# Patient Record
Sex: Female | Born: 2000 | Hispanic: No | Marital: Single | Smoking: Current every day smoker
Health system: Southern US, Community
[De-identification: ages and names within clinical notes are randomized; demographics above are authoritative.]

## PROBLEM LIST (undated history)

## (undated) DIAGNOSIS — J45909 Unspecified asthma, uncomplicated: Secondary | ICD-10-CM

## (undated) HISTORY — PX: TYMPANOSTOMY TUBE PLACEMENT: SHX32

## (undated) HISTORY — PX: ADENOIDECTOMY: SUR15

## (undated) HISTORY — PX: TONSILLECTOMY: SUR1361

---
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
## 2019-01-06 ENCOUNTER — Ambulatory Visit: Payer: BLUE CROSS/BLUE SHIELD | Admitting: Allergy & Immunology

## 2019-03-03 DIAGNOSIS — M545 Low back pain: Secondary | ICD-10-CM | POA: Diagnosis not present

## 2019-03-03 DIAGNOSIS — R202 Paresthesia of skin: Secondary | ICD-10-CM | POA: Diagnosis not present

## 2019-03-03 DIAGNOSIS — M5442 Lumbago with sciatica, left side: Secondary | ICD-10-CM | POA: Diagnosis not present

## 2019-03-24 DIAGNOSIS — M5442 Lumbago with sciatica, left side: Secondary | ICD-10-CM | POA: Diagnosis not present

## 2019-03-28 DIAGNOSIS — M5442 Lumbago with sciatica, left side: Secondary | ICD-10-CM | POA: Diagnosis not present

## 2019-04-04 DIAGNOSIS — M5442 Lumbago with sciatica, left side: Secondary | ICD-10-CM | POA: Diagnosis not present

## 2019-04-09 DIAGNOSIS — S7002XA Contusion of left hip, initial encounter: Secondary | ICD-10-CM | POA: Diagnosis not present

## 2019-04-09 DIAGNOSIS — J45909 Unspecified asthma, uncomplicated: Secondary | ICD-10-CM | POA: Diagnosis not present

## 2019-04-09 DIAGNOSIS — H6123 Impacted cerumen, bilateral: Secondary | ICD-10-CM | POA: Diagnosis not present

## 2019-04-09 DIAGNOSIS — S79912A Unspecified injury of left hip, initial encounter: Secondary | ICD-10-CM | POA: Diagnosis not present

## 2019-04-09 DIAGNOSIS — Z79899 Other long term (current) drug therapy: Secondary | ICD-10-CM | POA: Diagnosis not present

## 2019-04-09 DIAGNOSIS — M25552 Pain in left hip: Secondary | ICD-10-CM | POA: Diagnosis not present

## 2019-04-11 DIAGNOSIS — M5442 Lumbago with sciatica, left side: Secondary | ICD-10-CM | POA: Diagnosis not present

## 2019-06-14 DIAGNOSIS — M5442 Lumbago with sciatica, left side: Secondary | ICD-10-CM | POA: Diagnosis not present

## 2019-06-14 DIAGNOSIS — R202 Paresthesia of skin: Secondary | ICD-10-CM | POA: Diagnosis not present

## 2019-06-14 DIAGNOSIS — Z68.41 Body mass index (BMI) pediatric, greater than or equal to 95th percentile for age: Secondary | ICD-10-CM | POA: Diagnosis not present

## 2019-06-14 DIAGNOSIS — R5383 Other fatigue: Secondary | ICD-10-CM | POA: Diagnosis not present

## 2019-06-19 DIAGNOSIS — N920 Excessive and frequent menstruation with regular cycle: Secondary | ICD-10-CM | POA: Diagnosis not present

## 2019-06-20 DIAGNOSIS — M5442 Lumbago with sciatica, left side: Secondary | ICD-10-CM | POA: Diagnosis not present

## 2019-06-20 DIAGNOSIS — R200 Anesthesia of skin: Secondary | ICD-10-CM | POA: Diagnosis not present

## 2019-06-20 DIAGNOSIS — M5126 Other intervertebral disc displacement, lumbar region: Secondary | ICD-10-CM | POA: Diagnosis not present

## 2019-06-20 DIAGNOSIS — M48061 Spinal stenosis, lumbar region without neurogenic claudication: Secondary | ICD-10-CM | POA: Diagnosis not present

## 2019-07-04 DIAGNOSIS — M5442 Lumbago with sciatica, left side: Secondary | ICD-10-CM | POA: Diagnosis not present

## 2019-07-12 DIAGNOSIS — M5416 Radiculopathy, lumbar region: Secondary | ICD-10-CM | POA: Diagnosis not present

## 2019-07-12 DIAGNOSIS — M48062 Spinal stenosis, lumbar region with neurogenic claudication: Secondary | ICD-10-CM | POA: Diagnosis not present

## 2019-07-12 DIAGNOSIS — Z6841 Body Mass Index (BMI) 40.0 and over, adult: Secondary | ICD-10-CM | POA: Diagnosis not present

## 2019-07-20 DIAGNOSIS — M48062 Spinal stenosis, lumbar region with neurogenic claudication: Secondary | ICD-10-CM | POA: Diagnosis not present

## 2019-07-20 DIAGNOSIS — M5416 Radiculopathy, lumbar region: Secondary | ICD-10-CM | POA: Diagnosis not present

## 2019-07-22 DIAGNOSIS — Z20828 Contact with and (suspected) exposure to other viral communicable diseases: Secondary | ICD-10-CM | POA: Diagnosis not present

## 2019-08-08 DIAGNOSIS — M5416 Radiculopathy, lumbar region: Secondary | ICD-10-CM | POA: Diagnosis not present

## 2019-08-08 DIAGNOSIS — M48062 Spinal stenosis, lumbar region with neurogenic claudication: Secondary | ICD-10-CM | POA: Diagnosis not present

## 2019-10-08 DIAGNOSIS — R067 Sneezing: Secondary | ICD-10-CM | POA: Diagnosis not present

## 2019-10-08 DIAGNOSIS — R112 Nausea with vomiting, unspecified: Secondary | ICD-10-CM | POA: Diagnosis not present

## 2019-10-08 DIAGNOSIS — R197 Diarrhea, unspecified: Secondary | ICD-10-CM | POA: Diagnosis not present

## 2019-10-08 DIAGNOSIS — R509 Fever, unspecified: Secondary | ICD-10-CM | POA: Diagnosis not present

## 2019-10-08 DIAGNOSIS — R42 Dizziness and giddiness: Secondary | ICD-10-CM | POA: Diagnosis not present

## 2019-11-10 DIAGNOSIS — R110 Nausea: Secondary | ICD-10-CM | POA: Diagnosis not present

## 2019-11-10 DIAGNOSIS — H8309 Labyrinthitis, unspecified ear: Secondary | ICD-10-CM | POA: Diagnosis not present

## 2019-12-17 DIAGNOSIS — H6123 Impacted cerumen, bilateral: Secondary | ICD-10-CM | POA: Diagnosis not present

## 2019-12-17 DIAGNOSIS — R42 Dizziness and giddiness: Secondary | ICD-10-CM | POA: Diagnosis not present

## 2020-01-15 ENCOUNTER — Encounter (HOSPITAL_COMMUNITY): Payer: Self-pay | Admitting: *Deleted

## 2020-01-15 ENCOUNTER — Other Ambulatory Visit: Payer: Self-pay

## 2020-01-15 ENCOUNTER — Emergency Department (HOSPITAL_COMMUNITY)
Admission: EM | Admit: 2020-01-15 | Discharge: 2020-01-15 | Disposition: A | Discharge status: Home / Self Care | Payer: BC Managed Care – PPO | Attending: Emergency Medicine | Admitting: Emergency Medicine

## 2020-01-15 DIAGNOSIS — M5432 Sciatica, left side: Secondary | ICD-10-CM

## 2020-01-15 DIAGNOSIS — Z7722 Contact with and (suspected) exposure to environmental tobacco smoke (acute) (chronic): Secondary | ICD-10-CM | POA: Insufficient documentation

## 2020-01-15 DIAGNOSIS — M79605 Pain in left leg: Secondary | ICD-10-CM | POA: Diagnosis not present

## 2020-01-15 DIAGNOSIS — Z7951 Long term (current) use of inhaled steroids: Secondary | ICD-10-CM | POA: Insufficient documentation

## 2020-01-15 DIAGNOSIS — M5442 Lumbago with sciatica, left side: Secondary | ICD-10-CM | POA: Diagnosis not present

## 2020-01-15 DIAGNOSIS — J45909 Unspecified asthma, uncomplicated: Secondary | ICD-10-CM | POA: Insufficient documentation

## 2020-01-15 DIAGNOSIS — M545 Low back pain: Secondary | ICD-10-CM | POA: Diagnosis not present

## 2020-01-15 LAB — URINALYSIS, ROUTINE W REFLEX MICROSCOPIC
Bilirubin Urine: NEGATIVE
Glucose, UA: NEGATIVE mg/dL
Hgb urine dipstick: NEGATIVE
Ketones, ur: NEGATIVE mg/dL
Leukocytes,Ua: NEGATIVE
Nitrite: NEGATIVE
Protein, ur: NEGATIVE mg/dL
Specific Gravity, Urine: 1.011 (ref 1.005–1.030)
pH: 7 (ref 5.0–8.0)

## 2020-01-15 LAB — PREGNANCY, URINE: Preg Test, Ur: NEGATIVE

## 2020-01-15 MED ORDER — IBUPROFEN 600 MG PO TABS: 600.0000 mg | ORAL_TABLET | Freq: Four times a day (QID) | ORAL | 0 refills | Status: DC | PRN

## 2020-01-15 MED ORDER — IBUPROFEN 800 MG PO TABS
800.0000 mg | ORAL_TABLET | Freq: Once | ORAL | Status: AC
  Administered 2020-01-15: 800 mg via ORAL
  Filled 2020-01-15: qty 1

## 2020-01-15 MED ORDER — TRAMADOL HCL 50 MG PO TABS: 50.0000 mg | ORAL_TABLET | Freq: Four times a day (QID) | ORAL | 0 refills | Status: DC | PRN

## 2020-01-15 MED ORDER — CYCLOBENZAPRINE HCL 10 MG PO TABS: 10.0000 mg | ORAL_TABLET | Freq: Three times a day (TID) | ORAL | 0 refills | Status: DC | PRN

## 2020-01-15 MED ORDER — CYCLOBENZAPRINE HCL 10 MG PO TABS
10.0000 mg | ORAL_TABLET | Freq: Once | ORAL | Status: AC
  Administered 2020-01-15: 10 mg via ORAL
  Filled 2020-01-15: qty 1

## 2020-01-15 NOTE — ED Provider Notes (Signed)
[facility] EMERGENCY DEPARTMENT Provider Note   CSN: [id number] Arrival date & time: 01/15/20  1311     History Chief Complaint  Patient presents with  . Back Pain    Carrie Oneal is a 19 y.o. female.  HPI      Carrie Oneal is a 19 y.o. female who presents to the Emergency Department complaining of left low back and leg pain since yesterday.  She describes a sharp pain from the left side of her lower back that radiates into her hip and buttock down her left leg to the level of her foot.  She also reports a intermittent tingling and numbness sensation of her mid lower leg down to her foot.  This is positional and mostly when lying supine.  Symptoms improve with sitting.  She describes pain associated with walking or standing.  No known injury.  She reports having been diagnosed with sciatica last year and current symptoms feel similar.  She also endorses having increasing pain with urination but denies burning with urination, hematuria, abnormal vaginal bleeding or discharge.  She denies abdominal pain, fever, chills, urine or bowel incontinence or retention., weakness of her lower extremities and swelling.   Past Medical History:  Diagnosis Date  . Asthma     Patient Active Problem List   Diagnosis Date Noted  . Gastroesophageal reflux disease 09/29/2016  . Severe persistent asthma without complication 08/11/2016  . Seasonal and perennial allergic rhinitis 08/11/2016    Past Surgical History:  Procedure Laterality Date  . ADENOIDECTOMY    . TONSILLECTOMY    . TYMPANOSTOMY TUBE PLACEMENT       OB History   No obstetric history on file.     Family History  Problem Relation Age of Onset  . Asthma Father   . COPD Father   . Asthma Paternal Aunt   . Asthma Paternal Grandmother   . Asthma Paternal Grandfather   . Asthma Paternal Aunt     Social History   Tobacco Use  . Smoking status: Passive Smoke Exposure - Never Smoker  . Smokeless tobacco: Never Used    Vaping Use  . Vaping Use: Never used  Substance Use Topics  . Alcohol use: No  . Drug use: Yes    Types: Marijuana    Comment: everyday    Home Medications Prior to Admission medications   Medication Sig Start Date End Date Taking? Authorizing Provider  albuterol (PROVENTIL HFA;VENTOLIN HFA) 108 (90 Base) MCG/ACT inhaler Inhale into the lungs.    [provider]  budesonide-formoterol (SYMBICORT) 80-4.5 MCG/ACT inhaler Inhale 2 puffs into the lungs 2 (two) times daily. 07/08/18   Alfonse Spruce, MD  EPINEPHrine (AUVI-Q) 0.3 mg/0.3 mL IJ SOAJ injection Use as directed for severe allergic reaction 09/29/16   Alfonse Spruce, MD  fluticasone [facility]) 50 MCG/ACT nasal spray Place 2 sprays into both nostrils daily. 09/29/16   Alfonse Spruce, MD  montelukast (SINGULAIR) 10 MG tablet  07/20/16   [provider]  omeprazole (PRILOSEC) 20 MG capsule Take 1 capsule (20 mg total) by mouth daily. 08/11/16 07/22/17  Alfonse Spruce, MD  ondansetron [facility]) 8 MG tablet  08/31/17   [provider]    Allergies    Patient has no known allergies.  Review of Systems   Review of Systems  Constitutional: Negative for fever.  Respiratory: Negative for cough, chest tightness and shortness of breath.   Cardiovascular: Negative for chest pain.  Gastrointestinal: Negative for abdominal  pain, constipation, diarrhea, nausea and vomiting.  Genitourinary: Negative for decreased urine volume, difficulty urinating, dysuria, flank pain and hematuria.  Musculoskeletal: Positive for back pain. Negative for joint swelling and neck pain.  Skin: Negative for rash.  Neurological: Negative for dizziness, syncope, weakness and numbness.  Psychiatric/Behavioral: Negative for confusion.    Physical Exam Updated Vital Signs BP (!) 141/82   Pulse 82   Temp 98 F (36.7 C)   Resp 20   Ht 5\' 7"  (1.702 m)   Wt 129.3 kg   LMP 12/14/2019   SpO2 100%   BMI 44.64 kg/m    Physical Exam Vitals and nursing note reviewed.  Constitutional:      General: She is not in acute distress.    Appearance: Normal appearance. She is well-developed. She is not ill-appearing.  HENT:     Head: Normocephalic and atraumatic.     Mouth/Throat:     Mouth: Mucous membranes are moist.  Cardiovascular:     Rate and Rhythm: Normal rate and regular rhythm.     Pulses: Normal pulses.     Comments: DP pulses are strong and palpable bilaterally Pulmonary:     Effort: Pulmonary effort is normal. No respiratory distress.     Breath sounds: Normal breath sounds.  Abdominal:     General: There is no distension.     Palpations: Abdomen is soft.     Tenderness: There is no abdominal tenderness.  Musculoskeletal:        General: Tenderness present. No signs of injury.     Cervical back: Normal range of motion and neck supple.     Lumbar back: Tenderness present. No swelling, deformity or lacerations. Normal range of motion.     Right lower leg: No edema.     Left lower leg: No edema.     Comments: ttp of the left lower lumbar paraspinal muscles and left SI joint space.  Pt has 5/5 strength against resistance of bilateral lower extremities.  Sensation intact.  No tenderness or edema of the left posterior lower leg.  No palpable cord.   Skin:    General: Skin is warm.     Capillary Refill: Capillary refill takes less than 2 seconds.     Findings: No rash.  Neurological:     General: No focal deficit present.     Mental Status: She is alert and oriented to person, place, and time.     Sensory: No sensory deficit.     Motor: No weakness or abnormal muscle tone.     Gait: Gait normal.     Deep Tendon Reflexes:     Reflex Scores:      Patellar reflexes are 2+ on the right side and 2+ on the left side.      Achilles reflexes are 2+ on the right side and 2+ on the left side.    ED Results / Procedures / Treatments   Labs (all labs ordered are listed, but only abnormal results  are displayed) Labs Reviewed  PREGNANCY, URINE  URINALYSIS, ROUTINE W REFLEX MICROSCOPIC    EKG None  Radiology No results found.  Procedures Procedures (including critical care time)  Medications Ordered in ED Medications - No data to display  ED Course  I have reviewed the triage vital signs and the nursing notes.  Pertinent labs & imaging results that were available during my care of the patient were reviewed by me and considered in my medical decision making (see chart for  details).    MDM Rules/Calculators/A&P                          Patient with left lower back pain with radiating pain into her left leg.  Intermittent paresthesias of the lower leg and foot only.  Patient is ambulatory with a steady gait.  No focal neuro deficits.  There is no symptoms to suggest DVT, doubt cauda equina.  Symptoms are recurrent.  No reported injury or trauma.  Symptoms likely related to sciatica.  Patient agrees to treatment plan and close outpatient follow-up.  Return precautions discussed.   Final Clinical Impression(s) / ED Diagnoses Final diagnoses:  Sciatica of left side    Rx / DC Orders ED Discharge Orders    None       Pauline Aus, PA-C 01/17/20 4034    Bethann Berkshire, MD 01/21/20 [phone]

## 2020-01-15 NOTE — ED Triage Notes (Signed)
Low back pain radiating into legs, states legs and feet are numb when lying down

## 2020-01-15 NOTE — Discharge Instructions (Signed)
Alternate ice and heat to your lower back. Avoid bending over, twisting, or heavy lifting for at least 7 days. Follow-up with your primary provider for recheck

## 2020-02-15 DIAGNOSIS — R197 Diarrhea, unspecified: Secondary | ICD-10-CM | POA: Diagnosis not present

## 2020-02-15 DIAGNOSIS — R1110 Vomiting, unspecified: Secondary | ICD-10-CM | POA: Diagnosis not present

## 2020-02-26 DIAGNOSIS — Z20822 Contact with and (suspected) exposure to covid-19: Secondary | ICD-10-CM | POA: Diagnosis not present

## 2020-03-13 DIAGNOSIS — Z20822 Contact with and (suspected) exposure to covid-19: Secondary | ICD-10-CM | POA: Diagnosis not present

## 2020-04-03 DIAGNOSIS — Z20822 Contact with and (suspected) exposure to covid-19: Secondary | ICD-10-CM | POA: Diagnosis not present

## 2020-04-11 DIAGNOSIS — R1084 Generalized abdominal pain: Secondary | ICD-10-CM | POA: Diagnosis not present

## 2020-04-11 DIAGNOSIS — Z20828 Contact with and (suspected) exposure to other viral communicable diseases: Secondary | ICD-10-CM | POA: Diagnosis not present

## 2020-04-11 DIAGNOSIS — R197 Diarrhea, unspecified: Secondary | ICD-10-CM | POA: Diagnosis not present

## 2020-04-11 DIAGNOSIS — R1110 Vomiting, unspecified: Secondary | ICD-10-CM | POA: Diagnosis not present

## 2020-04-12 DIAGNOSIS — K529 Noninfective gastroenteritis and colitis, unspecified: Secondary | ICD-10-CM | POA: Diagnosis not present

## 2020-04-24 ENCOUNTER — Other Ambulatory Visit: Payer: Self-pay

## 2020-04-24 ENCOUNTER — Emergency Department (HOSPITAL_COMMUNITY)
Admission: EM | Admit: 2020-04-24 | Discharge: 2020-04-25 | Disposition: A | Discharge status: Home / Self Care | Payer: BC Managed Care – PPO | Attending: Emergency Medicine | Admitting: Emergency Medicine

## 2020-04-24 ENCOUNTER — Emergency Department (HOSPITAL_COMMUNITY): Payer: BC Managed Care – PPO

## 2020-04-24 ENCOUNTER — Encounter (HOSPITAL_COMMUNITY): Payer: Self-pay

## 2020-04-24 DIAGNOSIS — Z7722 Contact with and (suspected) exposure to environmental tobacco smoke (acute) (chronic): Secondary | ICD-10-CM | POA: Diagnosis not present

## 2020-04-24 DIAGNOSIS — K219 Gastro-esophageal reflux disease without esophagitis: Secondary | ICD-10-CM | POA: Insufficient documentation

## 2020-04-24 DIAGNOSIS — R1013 Epigastric pain: Secondary | ICD-10-CM | POA: Diagnosis not present

## 2020-04-24 DIAGNOSIS — K2970 Gastritis, unspecified, without bleeding: Secondary | ICD-10-CM | POA: Insufficient documentation

## 2020-04-24 DIAGNOSIS — Z7951 Long term (current) use of inhaled steroids: Secondary | ICD-10-CM | POA: Insufficient documentation

## 2020-04-24 DIAGNOSIS — R109 Unspecified abdominal pain: Secondary | ICD-10-CM | POA: Diagnosis not present

## 2020-04-24 DIAGNOSIS — J45909 Unspecified asthma, uncomplicated: Secondary | ICD-10-CM | POA: Diagnosis not present

## 2020-04-24 LAB — COMPREHENSIVE METABOLIC PANEL
ALT: 23 U/L (ref 0–44)
AST: 20 U/L (ref 15–41)
Albumin: 4.3 g/dL (ref 3.5–5.0)
Alkaline Phosphatase: 71 U/L (ref 38–126)
Anion gap: 10 (ref 5–15)
BUN: 9 mg/dL (ref 6–20)
CO2: 23 mmol/L (ref 22–32)
Calcium: 9.5 mg/dL (ref 8.9–10.3)
Chloride: 104 mmol/L (ref 98–111)
Creatinine, Ser: 0.68 mg/dL (ref 0.44–1.00)
GFR, Estimated: >60 mL/min (ref >60)
Glucose, Bld: 84 mg/dL (ref 70–99)
Potassium: 3.6 mmol/L (ref 3.5–5.1)
Sodium: 137 mmol/L (ref 135–145)
Total Bilirubin: 0.9 mg/dL (ref 0.3–1.2)
Total Protein: 8 g/dL (ref 6.5–8.1)

## 2020-04-24 LAB — CBC
HCT: 42.1 % (ref 36.0–46.0)
Hemoglobin: 14 g/dL (ref 12.0–15.0)
MCH: 31.3 pg (ref 26.0–34.0)
MCHC: 33.3 g/dL (ref 30.0–36.0)
MCV: 94.2 fL (ref 80.0–100.0)
Platelets: 305 10*3/uL (ref 150–400)
RBC: 4.47 MIL/uL (ref 3.87–5.11)
RDW: 11.7 % (ref 11.5–15.5)
WBC: 10.3 10*3/uL (ref 4.0–10.5)
nRBC: 0 % (ref 0.0–0.2)

## 2020-04-24 LAB — URINALYSIS, ROUTINE W REFLEX MICROSCOPIC
Bacteria, UA: NONE SEEN
Bilirubin Urine: NEGATIVE
Glucose, UA: NEGATIVE mg/dL
Ketones, ur: 20 mg/dL — AB
Leukocytes,Ua: NEGATIVE
Nitrite: NEGATIVE
Protein, ur: 30 mg/dL — AB
Specific Gravity, Urine: 1.035 — ABNORMAL HIGH (ref 1.005–1.030)
pH: 5 (ref 5.0–8.0)

## 2020-04-24 LAB — POC URINE PREG, ED: Preg Test, Ur: NEGATIVE

## 2020-04-24 LAB — LIPASE, BLOOD: Lipase: 16 U/L (ref 11–51)

## 2020-04-24 MED ORDER — LACTATED RINGERS IV BOLUS
500.0000 mL | Freq: Once | INTRAVENOUS | Status: AC
  Administered 2020-04-24: 500 mL via INTRAVENOUS

## 2020-04-24 MED ORDER — IOHEXOL 300 MG/ML  SOLN
100.0000 mL | Freq: Once | INTRAMUSCULAR | Status: AC | PRN
  Administered 2020-04-25: 100 mL via INTRAVENOUS

## 2020-04-24 MED ORDER — PROCHLORPERAZINE EDISYLATE 10 MG/2ML IJ SOLN
10.0000 mg | Freq: Once | INTRAMUSCULAR | Status: AC
  Administered 2020-04-24: 10 mg via INTRAVENOUS
  Filled 2020-04-24: qty 2

## 2020-04-24 MED ORDER — FENTANYL CITRATE (PF) 100 MCG/2ML IJ SOLN
50.0000 ug | Freq: Once | INTRAMUSCULAR | Status: AC
  Administered 2020-04-24: 50 ug via INTRAVENOUS
  Filled 2020-04-24: qty 2

## 2020-04-24 NOTE — ED Triage Notes (Signed)
Pt to er, pt states that she is here for some abd pain for the past 3-4 weeks, states that she saw her pmd and was told to come to the er for r/o problems with her gallbladder.

## 2020-04-24 NOTE — ED Notes (Signed)
Per registration pt left, but then pt returned and states that her mother wont let her leave.

## 2020-04-25 ENCOUNTER — Encounter (INDEPENDENT_AMBULATORY_CARE_PROVIDER_SITE_OTHER): Payer: Self-pay | Admitting: *Deleted

## 2020-04-25 DIAGNOSIS — R109 Unspecified abdominal pain: Secondary | ICD-10-CM | POA: Diagnosis not present

## 2020-04-25 MED ORDER — AMOXICILLIN 500 MG PO CAPS: 1000.0000 mg | ORAL_CAPSULE | Freq: Two times a day (BID) | ORAL | 0 refills | Status: AC

## 2020-04-25 MED ORDER — CLARITHROMYCIN 500 MG PO TABS
500.0000 mg | ORAL_TABLET | Freq: Once | ORAL | Status: AC
  Administered 2020-04-25: 500 mg via ORAL
  Filled 2020-04-25 (×2): qty 1

## 2020-04-25 MED ORDER — PANTOPRAZOLE SODIUM 20 MG PO TBEC: 20.0000 mg | DELAYED_RELEASE_TABLET | Freq: Two times a day (BID) | ORAL | 0 refills | Status: DC

## 2020-04-25 MED ORDER — CLARITHROMYCIN 500 MG PO TABS: 500.0000 mg | ORAL_TABLET | Freq: Two times a day (BID) | ORAL | 0 refills | Status: AC

## 2020-04-25 MED ORDER — AMOXICILLIN 250 MG PO CAPS
1000.0000 mg | ORAL_CAPSULE | Freq: Once | ORAL | Status: AC
  Administered 2020-04-25: 1000 mg via ORAL
  Filled 2020-04-25: qty 4

## 2020-04-25 MED ORDER — PANTOPRAZOLE SODIUM 40 MG PO TBEC
40.0000 mg | DELAYED_RELEASE_TABLET | Freq: Once | ORAL | Status: AC
  Administered 2020-04-25: 40 mg via ORAL
  Filled 2020-04-25: qty 1

## 2020-04-25 NOTE — ED Provider Notes (Signed)
[facility] EMERGENCY DEPARTMENT Provider Note   CSN: [id number] Arrival date & time: 04/24/20  1923     History Chief Complaint  Patient presents with  . Abdominal Pain    Carrie Oneal is a 19 y.o. female.  Multiple episodes of nonbloody nonbilious emesis over last 3 to 4 weeks.  Associated with pretty severe epigastric pain that radiates to left.  States it happens within a minute or 2 of eating.  Has tested positive for H. pylori but not started on any gastritis medications.  Dr. Jamal Maes hyoscyamine which helped a little bit.  Poorly seen here to rule out gallbladder pathology.   Abdominal Pain Pain location:  Epigastric and LUQ Pain quality: gnawing and sharp   Pain radiates to:  Chest Pain severity:  Mild Timing:  Intermittent Progression:  Waxing and waning Context: eating   Worsened by:  Eating Ineffective treatments:  None tried      Past Medical History:  Diagnosis Date  . Asthma     Patient Active Problem List   Diagnosis Date Noted  . Gastroesophageal reflux disease 09/29/2016  . Severe persistent asthma without complication 08/11/2016  . Seasonal and perennial allergic rhinitis 08/11/2016    Past Surgical History:  Procedure Laterality Date  . ADENOIDECTOMY    . TONSILLECTOMY    . TYMPANOSTOMY TUBE PLACEMENT       OB History   No obstetric history on file.     Family History  Problem Relation Age of Onset  . Asthma Father   . COPD Father   . Asthma Paternal Aunt   . Asthma Paternal Grandmother   . Asthma Paternal Grandfather   . Asthma Paternal Aunt     Social History   Tobacco Use  . Smoking status: Passive Smoke Exposure - Never Smoker  . Smokeless tobacco: Never Used  Vaping Use  . Vaping Use: Never used  Substance Use Topics  . Alcohol use: No  . Drug use: Yes    Types: Marijuana    Comment: everyday    Home Medications Prior to Admission medications   Medication Sig Start Date End Date Taking? Authorizing Provider    albuterol (PROVENTIL HFA;VENTOLIN HFA) 108 (90 Base) MCG/ACT inhaler Inhale 1-2 puffs into the lungs every 6 (six) hours as needed for wheezing or shortness of breath.     [provider]  amoxicillin (AMOXIL) 500 MG capsule Take 2 capsules (1,000 mg total) by mouth 2 (two) times daily for 10 days. 04/25/20 05/05/20  Namya Voges, Barbara Cower, MD  budesonide-formoterol (SYMBICORT) 80-4.5 MCG/ACT inhaler Inhale 2 puffs into the lungs 2 (two) times daily. Patient taking differently: Inhale 2 puffs into the lungs daily as needed (for shortness of breath).  07/08/18   Alfonse Spruce, MD  clarithromycin (BIAXIN) 500 MG tablet Take 1 tablet (500 mg total) by mouth 2 (two) times daily for 10 days. 04/25/20 05/05/20  Miyuki Rzasa, Barbara Cower, MD  cyclobenzaprine (FLEXERIL) 10 MG tablet Take 1 tablet (10 mg total) by mouth 3 (three) times daily as needed. 01/15/20   Triplett, Tammy, PA-C  EPINEPHrine (AUVI-Q) 0.3 mg/0.3 mL IJ SOAJ injection Use as directed for severe allergic reaction Patient taking differently: Inject 0.3 mg into the muscle as needed for anaphylaxis. Use as directed for severe allergic reaction 09/29/16   Alfonse Spruce, MD  etonogestrel (NEXPLANON) 68 MG IMPL implant 1 each by Subdermal route once.    [provider]  ibuprofen (IBU) 600 MG tablet Take 1 tablet (600 mg total)  by mouth every 6 (six) hours as needed for moderate pain. Take with food 01/15/20   Triplett, Tammy, PA-C  pantoprazole (PROTONIX) 20 MG tablet Take 1 tablet (20 mg total) by mouth 2 (two) times daily for 10 days. 04/25/20 05/05/20  Whittaker Lenis, Barbara Cower, MD  traMADol (ULTRAM) 50 MG tablet Take 1 tablet (50 mg total) by mouth every 6 (six) hours as needed. 01/15/20   Triplett, Tammy, PA-C    Allergies    Patient has no known allergies.  Review of Systems   Review of Systems  Gastrointestinal: Positive for abdominal pain.  All other systems reviewed and are negative.   Physical Exam Updated Vital Signs BP 110/84 (BP  Location: Left Arm)   Pulse 73   Temp 98.4 F (36.9 C) (Oral)   Resp 18   Ht 5\' 7"  (1.702 m)   Wt 124.7 kg   SpO2 98%   BMI 43.07 kg/m   Physical Exam Vitals and nursing note reviewed.  Constitutional:      Appearance: She is well-developed.  HENT:     Head: Normocephalic and atraumatic.     Mouth/Throat:     Mouth: Mucous membranes are moist.     Pharynx: Oropharynx is clear.  Eyes:     Pupils: Pupils are equal, round, and reactive to light.  Cardiovascular:     Rate and Rhythm: Normal rate and regular rhythm.  Pulmonary:     Effort: No respiratory distress.     Breath sounds: No stridor.  Abdominal:     General: There is no distension.     Tenderness: There is abdominal tenderness (epigastric and LUQ).  Musculoskeletal:        General: No swelling or tenderness. Normal range of motion.     Cervical back: Normal range of motion.  Skin:    General: Skin is warm and dry.  Neurological:     General: No focal deficit present.     Mental Status: She is alert.     ED Results / Procedures / Treatments   Labs (all labs ordered are listed, but only abnormal results are displayed) Labs Reviewed  URINALYSIS, ROUTINE W REFLEX MICROSCOPIC - Abnormal; Notable for the following components:      Result Value   Color, Urine AMBER (*)    Specific Gravity, Urine 1.035 (*)    Hgb urine dipstick MODERATE (*)    Ketones, ur 20 (*)    Protein, ur 30 (*)    All other components within normal limits  LIPASE, BLOOD  COMPREHENSIVE METABOLIC PANEL  CBC  POC URINE PREG, ED    EKG None  Radiology CT ABDOMEN PELVIS W CONTRAST  Result Date: 04/25/2020 CLINICAL DATA:  Abdominal pain EXAM: CT ABDOMEN AND PELVIS WITH CONTRAST TECHNIQUE: Multidetector CT imaging of the abdomen and pelvis was performed using the standard protocol following bolus administration of intravenous contrast. CONTRAST:  04/27/2020 OMNIPAQUE IOHEXOL 300 MG/ML  SOLN COMPARISON:  None. FINDINGS: Lower chest: The  visualized heart size within normal limits. No pericardial fluid/thickening. No hiatal hernia. The visualized portions of the lungs are clear. Hepatobiliary: The liver is normal in density without focal abnormality.The main portal vein is patent. No evidence of calcified gallstones, gallbladder wall thickening or biliary dilatation. Pancreas: Unremarkable. No pancreatic ductal dilatation or surrounding inflammatory changes. Spleen: Normal in size without focal abnormality. Adrenals/Urinary Tract: Both adrenal glands appear normal. The kidneys and collecting system appear normal without evidence of urinary tract calculus or hydronephrosis. Bladder is unremarkable. Stomach/Bowel:  The stomach, small bowel, and colon are normal in appearance. No inflammatory changes, wall thickening, or obstructive findings.The appendix is normal. Vascular/Lymphatic: There are no enlarged mesenteric, retroperitoneal, or pelvic lymph nodes. No significant vascular findings are present. Reproductive: Probable 2.5 cm left ovarian cyst versus dominant follicle. Other: No evidence of abdominal wall mass or hernia. Musculoskeletal: No acute or significant osseous findings. IMPRESSION: No acute intra-abdominal to explain the patient's symptoms. Probable 2.5 cm left ovarian cyst versus dominant follicle. Electronically Signed   By: Jonna Clark M.D.   On: 04/25/2020 00:18    Procedures Procedures (including critical care time)  Medications Ordered in ED Medications  fentaNYL (SUBLIMAZE) injection 50 mcg (50 mcg Intravenous Given 04/24/20 2342)  lactated ringers bolus 500 mL (0 mLs Intravenous Stopped 04/25/20 0114)  prochlorperazine (COMPAZINE) injection 10 mg (10 mg Intravenous Given 04/24/20 2342)  iohexol (OMNIPAQUE) 300 MG/ML solution 100 mL (100 mLs Intravenous Contrast Given 04/25/20 0003)  amoxicillin (AMOXIL) capsule 1,000 mg (1,000 mg Oral Given 04/25/20 0114)  clarithromycin (BIAXIN) tablet 500 mg (500 mg Oral Given  04/25/20 0118)  pantoprazole (PROTONIX) EC tablet 40 mg (40 mg Oral Given 04/25/20 0114)    ED Course  I have reviewed the triage vital signs and the nursing notes.  Pertinent labs & imaging results that were available during my care of the patient were reviewed by me and considered in my medical decision making (see chart for details).    MDM Rules/Calculators/A&P                          I think it is unlikely to be a gall baldder problem. epigastric and LUQ abdominal pain with emesis shortly after eating. Reportedly positive for h pylori already. I think PUD is most likely so will initiate triple therapy if ct negative.   Ct fine. Will plan for GI follow up.   Final Clinical Impression(s) / ED Diagnoses Final diagnoses:  Gastritis without bleeding, unspecified chronicity, unspecified gastritis type    Rx / DC Orders ED Discharge Orders         Ordered    amoxicillin (AMOXIL) 500 MG capsule  2 times daily        04/25/20 0041    pantoprazole (PROTONIX) 20 MG tablet  2 times daily        04/25/20 0041    clarithromycin (BIAXIN) 500 MG tablet  2 times daily        04/25/20 0041    Ambulatory referral to Gastroenterology        04/25/20 0041           Marguarite Markov, Barbara Cower, MD 04/25/20 0246

## 2020-04-26 DIAGNOSIS — K279 Peptic ulcer, site unspecified, unspecified as acute or chronic, without hemorrhage or perforation: Secondary | ICD-10-CM | POA: Diagnosis not present

## 2020-05-02 ENCOUNTER — Other Ambulatory Visit: Payer: Self-pay

## 2020-05-02 ENCOUNTER — Emergency Department (HOSPITAL_COMMUNITY)
Admission: EM | Admit: 2020-05-02 | Discharge: 2020-05-02 | Disposition: A | Discharge status: Home / Self Care | Payer: BC Managed Care – PPO | Attending: Emergency Medicine | Admitting: Emergency Medicine

## 2020-05-02 ENCOUNTER — Encounter (HOSPITAL_COMMUNITY): Payer: Self-pay | Admitting: Emergency Medicine

## 2020-05-02 DIAGNOSIS — B373 Candidiasis of vulva and vagina: Secondary | ICD-10-CM | POA: Insufficient documentation

## 2020-05-02 DIAGNOSIS — Z79899 Other long term (current) drug therapy: Secondary | ICD-10-CM | POA: Diagnosis not present

## 2020-05-02 DIAGNOSIS — K219 Gastro-esophageal reflux disease without esophagitis: Secondary | ICD-10-CM | POA: Diagnosis not present

## 2020-05-02 DIAGNOSIS — R1084 Generalized abdominal pain: Secondary | ICD-10-CM | POA: Diagnosis not present

## 2020-05-02 DIAGNOSIS — J4550 Severe persistent asthma, uncomplicated: Secondary | ICD-10-CM | POA: Diagnosis not present

## 2020-05-02 DIAGNOSIS — Z7722 Contact with and (suspected) exposure to environmental tobacco smoke (acute) (chronic): Secondary | ICD-10-CM | POA: Insufficient documentation

## 2020-05-02 DIAGNOSIS — B3731 Acute candidiasis of vulva and vagina: Secondary | ICD-10-CM

## 2020-05-02 DIAGNOSIS — R109 Unspecified abdominal pain: Secondary | ICD-10-CM | POA: Diagnosis not present

## 2020-05-02 LAB — CBC WITH DIFFERENTIAL/PLATELET
Abs Immature Granulocytes: 0.03 10*3/uL (ref 0.00–0.07)
Basophils Absolute: 0.1 10*3/uL (ref 0.0–0.1)
Basophils Relative: 1 %
Eosinophils Absolute: 0.2 10*3/uL (ref 0.0–0.5)
Eosinophils Relative: 2 %
HCT: 41.3 % (ref 36.0–46.0)
Hemoglobin: 13.6 g/dL (ref 12.0–15.0)
Immature Granulocytes: 0 %
Lymphocytes Relative: 33 %
Lymphs Abs: 3.4 10*3/uL (ref 0.7–4.0)
MCH: 31.2 pg (ref 26.0–34.0)
MCHC: 32.9 g/dL (ref 30.0–36.0)
MCV: 94.7 fL (ref 80.0–100.0)
Monocytes Absolute: 0.8 10*3/uL (ref 0.1–1.0)
Monocytes Relative: 8 %
Neutro Abs: 5.8 10*3/uL (ref 1.7–7.7)
Neutrophils Relative %: 56 %
Platelets: 310 10*3/uL (ref 150–400)
RBC: 4.36 MIL/uL (ref 3.87–5.11)
RDW: 11.8 % (ref 11.5–15.5)
WBC: 10.3 10*3/uL (ref 4.0–10.5)
nRBC: 0 % (ref 0.0–0.2)

## 2020-05-02 LAB — COMPREHENSIVE METABOLIC PANEL
ALT: 24 U/L (ref 0–44)
AST: 21 U/L (ref 15–41)
Albumin: 4.3 g/dL (ref 3.5–5.0)
Alkaline Phosphatase: 70 U/L (ref 38–126)
Anion gap: 7 (ref 5–15)
BUN: 10 mg/dL (ref 6–20)
CO2: 25 mmol/L (ref 22–32)
Calcium: 9.1 mg/dL (ref 8.9–10.3)
Chloride: 103 mmol/L (ref 98–111)
Creatinine, Ser: 0.86 mg/dL (ref 0.44–1.00)
GFR, Estimated: >60 mL/min (ref >60)
Glucose, Bld: 103 mg/dL — ABNORMAL HIGH (ref 70–99)
Potassium: 3.3 mmol/L — ABNORMAL LOW (ref 3.5–5.1)
Sodium: 135 mmol/L (ref 135–145)
Total Bilirubin: 0.7 mg/dL (ref 0.3–1.2)
Total Protein: 7.6 g/dL (ref 6.5–8.1)

## 2020-05-02 LAB — WET PREP, GENITAL
Clue Cells Wet Prep HPF POC: NONE SEEN
Sperm: NONE SEEN
Trich, Wet Prep: NONE SEEN
Yeast Wet Prep HPF POC: NONE SEEN

## 2020-05-02 LAB — LIPASE, BLOOD: Lipase: 19 U/L (ref 11–51)

## 2020-05-02 MED ORDER — ONDANSETRON HCL 4 MG/2ML IJ SOLN
4.0000 mg | Freq: Once | INTRAMUSCULAR | Status: AC
  Administered 2020-05-02: 4 mg via INTRAVENOUS
  Filled 2020-05-02: qty 2

## 2020-05-02 MED ORDER — SODIUM CHLORIDE 0.9 % IV BOLUS
1000.0000 mL | Freq: Once | INTRAVENOUS | Status: AC
  Administered 2020-05-02: 1000 mL via INTRAVENOUS

## 2020-05-02 MED ORDER — FLUCONAZOLE 100 MG PO TABS
200.0000 mg | ORAL_TABLET | Freq: Once | ORAL | Status: AC
  Administered 2020-05-02: 200 mg via ORAL
  Filled 2020-05-02: qty 2

## 2020-05-02 MED ORDER — HYDROCODONE-ACETAMINOPHEN 5-325 MG PO TABS
1.0000 | ORAL_TABLET | ORAL | 0 refills | Status: DC | PRN
Start: 2020-05-02 — End: 2020-05-29

## 2020-05-02 MED ORDER — FLUCONAZOLE 200 MG PO TABS: 200.0000 mg | ORAL_TABLET | Freq: Every day | ORAL | 0 refills | Status: DC

## 2020-05-02 MED ORDER — LIDOCAINE HCL URETHRAL/MUCOSAL 2 % EX GEL
1.0000 "application " | Freq: Once | CUTANEOUS | Status: AC
  Administered 2020-05-02: 1 via TOPICAL
  Filled 2020-05-02: qty 10

## 2020-05-02 MED ORDER — MORPHINE SULFATE (PF) 4 MG/ML IV SOLN
4.0000 mg | Freq: Once | INTRAVENOUS | Status: AC
  Administered 2020-05-02: 4 mg via INTRAVENOUS
  Filled 2020-05-02: qty 1

## 2020-05-02 NOTE — ED Triage Notes (Signed)
Pt c/o brown reddish drainage from vagina and states her vagina is closing up. Pt also c/o vaginal itching. Pt states her pee hole is bigger than her vagina.

## 2020-05-02 NOTE — ED Provider Notes (Signed)
[facility] EMERGENCY DEPARTMENT Provider Note   CSN: [id number] Arrival date & time: 05/02/20  0201     History Chief Complaint  Patient presents with  . Vaginal Discharge    Carrie Oneal is a 19 y.o. female.  Patient reports "My vagina is closing up.  My pee hole is bigger than my womb hole."  Patient was recently seen in the emergency department for abdominal pain.  She has been on antibiotics for presumed ulcer.  She reports that her abdominal pain has been persistent but now she has having increased pain in the area of her vagina that overpowered the abdominal pain.  She is scheduled for follow-up with GI but not until December.  Patient also has persistent low back pain from "bulging disks".  She cannot take the pain medicine for her back because of the presumed ulcer.        Past Medical History:  Diagnosis Date  . Asthma     Patient Active Problem List   Diagnosis Date Noted  . Gastroesophageal reflux disease 09/29/2016  . Severe persistent asthma without complication 08/11/2016  . Seasonal and perennial allergic rhinitis 08/11/2016    Past Surgical History:  Procedure Laterality Date  . ADENOIDECTOMY    . TONSILLECTOMY    . TYMPANOSTOMY TUBE PLACEMENT       OB History   No obstetric history on file.     Family History  Problem Relation Age of Onset  . Asthma Father   . COPD Father   . Asthma Paternal Aunt   . Asthma Paternal Grandmother   . Asthma Paternal Grandfather   . Asthma Paternal Aunt     Social History   Tobacco Use  . Smoking status: Passive Smoke Exposure - Never Smoker  . Smokeless tobacco: Never Used  Vaping Use  . Vaping Use: Every day  Substance Use Topics  . Alcohol use: No  . Drug use: Yes    Types: Marijuana    Comment: everyday    Home Medications Prior to Admission medications   Medication Sig Start Date End Date Taking? Authorizing Provider  albuterol (PROVENTIL HFA;VENTOLIN HFA) 108 (90 Base) MCG/ACT inhaler  Inhale 1-2 puffs into the lungs every 6 (six) hours as needed for wheezing or shortness of breath.     [provider]  amoxicillin (AMOXIL) 500 MG capsule Take 2 capsules (1,000 mg total) by mouth 2 (two) times daily for 10 days. 04/25/20 05/05/20  Mesner, Barbara Cower, MD  budesonide-formoterol (SYMBICORT) 80-4.5 MCG/ACT inhaler Inhale 2 puffs into the lungs 2 (two) times daily. Patient taking differently: Inhale 2 puffs into the lungs daily as needed (for shortness of breath).  07/08/18   Alfonse Spruce, MD  clarithromycin (BIAXIN) 500 MG tablet Take 1 tablet (500 mg total) by mouth 2 (two) times daily for 10 days. 04/25/20 05/05/20  Mesner, Barbara Cower, MD  cyclobenzaprine (FLEXERIL) 10 MG tablet Take 1 tablet (10 mg total) by mouth 3 (three) times daily as needed. 01/15/20   Triplett, Tammy, PA-C  EPINEPHrine (AUVI-Q) 0.3 mg/0.3 mL IJ SOAJ injection Use as directed for severe allergic reaction Patient taking differently: Inject 0.3 mg into the muscle as needed for anaphylaxis. Use as directed for severe allergic reaction 09/29/16   Alfonse Spruce, MD  etonogestrel (NEXPLANON) 68 MG IMPL implant 1 each by Subdermal route once.    [provider]  fluconazole (DIFLUCAN) 200 MG tablet Take 1 tablet (200 mg total) by mouth daily. 05/02/20   Jaci Carrel  J, MD  HYDROcodone-acetaminophen (NORCO/VICODIN) 5-325 MG tablet Take 1-2 tablets by mouth every 4 (four) hours as needed. 05/02/20   Gilda Crease, MD  ibuprofen (IBU) 600 MG tablet Take 1 tablet (600 mg total) by mouth every 6 (six) hours as needed for moderate pain. Take with food 01/15/20   Triplett, Tammy, PA-C  pantoprazole (PROTONIX) 20 MG tablet Take 1 tablet (20 mg total) by mouth 2 (two) times daily for 10 days. 04/25/20 05/05/20  Mesner, Barbara Cower, MD  traMADol (ULTRAM) 50 MG tablet Take 1 tablet (50 mg total) by mouth every 6 (six) hours as needed. 01/15/20   Triplett, Tammy, PA-C    Allergies    Patient has no known  allergies.  Review of Systems   Review of Systems  Gastrointestinal: Positive for abdominal pain.  Genitourinary: Positive for vaginal pain.  Musculoskeletal: Positive for back pain.  All other systems reviewed and are negative.   Physical Exam Updated Vital Signs BP (!) 139/93   Pulse 80   Temp 97.8 F (36.6 C)   Resp 18   Ht 5\' 7"  (1.702 m)   Wt 117.9 kg   LMP 04/25/2020   SpO2 100%   BMI 40.72 kg/m   Physical Exam Vitals and nursing note reviewed. Exam conducted with a chaperone present.  Constitutional:      General: She is not in acute distress.    Appearance: Normal appearance. She is well-developed.  HENT:     Head: Normocephalic and atraumatic.     Right Ear: Hearing normal.     Left Ear: Hearing normal.     Nose: Nose normal.  Eyes:     Conjunctiva/sclera: Conjunctivae normal.     Pupils: Pupils are equal, round, and reactive to light.  Cardiovascular:     Rate and Rhythm: Regular rhythm.     Heart sounds: S1 normal and S2 normal. No murmur heard.  No friction rub. No gallop.   Pulmonary:     Effort: Pulmonary effort is normal. No respiratory distress.     Breath sounds: Normal breath sounds.  Chest:     Chest wall: No tenderness.  Abdominal:     General: Bowel sounds are normal.     Palpations: Abdomen is soft.     Tenderness: There is no abdominal tenderness. There is no guarding or rebound. Negative signs include Murphy's sign and McBurney's sign.     Hernia: No hernia is present.  Genitourinary:    Labia:        Right: Rash and tenderness present.        Left: Rash and tenderness present.      Comments: External exam reveals significant erythema and swelling of the vulva with white patches and discharge on mucosa consistent with yeast.  Speculum exam deferred because of pain. Musculoskeletal:        General: Normal range of motion.     Cervical back: Normal range of motion and neck supple.  Skin:    General: Skin is warm and dry.     Findings:  No rash.  Neurological:     Mental Status: She is alert and oriented to person, place, and time.     GCS: GCS eye subscore is 4. GCS verbal subscore is 5. GCS motor subscore is 6.     Cranial Nerves: No cranial nerve deficit.     Sensory: No sensory deficit.     Coordination: Coordination normal.  Psychiatric:        Speech:  Speech normal.        Behavior: Behavior normal.        Thought Content: Thought content normal.     ED Results / Procedures / Treatments   Labs (all labs ordered are listed, but only abnormal results are displayed) Labs Reviewed  WET PREP, GENITAL - Abnormal; Notable for the following components:      Result Value   WBC, Wet Prep HPF POC MODERATE (*)    All other components within normal limits  COMPREHENSIVE METABOLIC PANEL - Abnormal; Notable for the following components:   Potassium 3.3 (*)    Glucose, Bld 103 (*)    All other components within normal limits  CBC WITH DIFFERENTIAL/PLATELET  LIPASE, BLOOD  GC/CHLAMYDIA PROBE AMP ([place]) NOT AT [facility]    EKG None  Radiology No results found.  Procedures Procedures (including critical care time)  Medications Ordered in ED Medications  sodium chloride 0.9 % bolus 1,000 mL (0 mLs Intravenous Stopped 05/02/20 0501)  morphine 4 MG/ML injection 4 mg (4 mg Intravenous Given 05/02/20 0307)  ondansetron (ZOFRAN) injection 4 mg (4 mg Intravenous Given 05/02/20 0308)  lidocaine (XYLOCAINE) 2 % jelly 1 application (1 application Topical Given 05/02/20 0308)  fluconazole (DIFLUCAN) tablet 200 mg (200 mg Oral Given 05/02/20 0307)    ED Course  I have reviewed the triage vital signs and the nursing notes.  Pertinent labs & imaging results that were available during my care of the patient were reviewed by me and considered in my medical decision making (see chart for details).    MDM Rules/Calculators/A&P                          Chaperoned pelvic exam reveals significant vulvovaginitis secondary to the  antibiotic use.  Will treat with Diflucan.  Patient continues to complain of diffuse abdominal pain.  Abdominal exam is benign.  Lab work is unremarkable.  She is being treated for presumed peptic ulcer.  No evidence of GI bleeding or anemia.  Patient has GI follow-up.  Final Clinical Impression(s) / ED Diagnoses Final diagnoses:  Vulvovaginal candidiasis  Generalized abdominal pain    Rx / DC Orders ED Discharge Orders         Ordered    fluconazole (DIFLUCAN) 200 MG tablet  Daily        05/02/20 0340    HYDROcodone-acetaminophen (NORCO/VICODIN) 5-325 MG tablet  Every 4 hours PRN        05/02/20 0341           Gilda Crease, MD 05/02/20 [phone]

## 2020-05-03 LAB — GC/CHLAMYDIA PROBE AMP (~~LOC~~) NOT AT ARMC
Chlamydia: NEGATIVE
Comment: NEGATIVE
Comment: NORMAL
Neisseria Gonorrhea: NEGATIVE

## 2020-05-03 MED FILL — Hydrocodone-Acetaminophen Tab 5-325 MG: ORAL | Qty: 6 | Status: AC

## 2020-05-06 DIAGNOSIS — Z01419 Encounter for gynecological examination (general) (routine) without abnormal findings: Secondary | ICD-10-CM | POA: Diagnosis not present

## 2020-05-06 DIAGNOSIS — N760 Acute vaginitis: Secondary | ICD-10-CM | POA: Diagnosis not present

## 2020-05-06 DIAGNOSIS — Z6841 Body Mass Index (BMI) 40.0 and over, adult: Secondary | ICD-10-CM | POA: Diagnosis not present

## 2020-05-14 DIAGNOSIS — M48062 Spinal stenosis, lumbar region with neurogenic claudication: Secondary | ICD-10-CM | POA: Diagnosis not present

## 2020-05-14 DIAGNOSIS — Z6841 Body Mass Index (BMI) 40.0 and over, adult: Secondary | ICD-10-CM | POA: Diagnosis not present

## 2020-05-14 DIAGNOSIS — M5416 Radiculopathy, lumbar region: Secondary | ICD-10-CM | POA: Diagnosis not present

## 2020-05-29 ENCOUNTER — Ambulatory Visit (INDEPENDENT_AMBULATORY_CARE_PROVIDER_SITE_OTHER): Payer: BC Managed Care – PPO | Admitting: Gastroenterology

## 2020-05-29 ENCOUNTER — Encounter (INDEPENDENT_AMBULATORY_CARE_PROVIDER_SITE_OTHER): Payer: Self-pay

## 2020-05-29 ENCOUNTER — Other Ambulatory Visit (INDEPENDENT_AMBULATORY_CARE_PROVIDER_SITE_OTHER): Payer: Self-pay

## 2020-05-29 ENCOUNTER — Encounter (INDEPENDENT_AMBULATORY_CARE_PROVIDER_SITE_OTHER): Payer: Self-pay | Admitting: Gastroenterology

## 2020-05-29 ENCOUNTER — Other Ambulatory Visit: Payer: Self-pay

## 2020-05-29 DIAGNOSIS — Z8619 Personal history of other infectious and parasitic diseases: Secondary | ICD-10-CM

## 2020-05-29 DIAGNOSIS — R109 Unspecified abdominal pain: Secondary | ICD-10-CM | POA: Insufficient documentation

## 2020-05-29 DIAGNOSIS — R1012 Left upper quadrant pain: Secondary | ICD-10-CM | POA: Diagnosis not present

## 2020-05-29 MED ORDER — DICYCLOMINE HCL 10 MG PO CAPS: 10.0000 mg | ORAL_CAPSULE | Freq: Two times a day (BID) | ORAL | 1 refills | Status: DC | PRN

## 2020-05-29 NOTE — Patient Instructions (Addendum)
Schedule EGD Start Bentyl 1 tablet q12h as needed for abdominal pain Schedule breath test

## 2020-05-29 NOTE — Progress Notes (Signed)
Carrie Oneal, M.D. Gastroenterology & Hepatology [facility] [address] [place], [place] [address] Primary Care Physician: Carrie Oneal, [place] [address] [place] [address]  Referring MD: Carrie Sabal, PA  I will communicate my assessment and recommendations to the referring MD via EMR. Note: Occasional unusual wording and randomly placed punctuation marks may result from the use of speech recognition technology to transcribe this document"  Chief Complaint: Abdominal pain and vomiting  History of Present Illness: Carrie Oneal is a 19 y.o. female with asthma, spinal disk herniation and sciatica, who presents for evaluation of abdominal pain and vomiting.  Patient reports that since September 2021 she has presented recurrent episodes of postprandial abdominal pain. She states that the pain is described as stabbing, which does not radiate and is located in the LUQ and epigastric area. She states that if she does not eat she will not have the pain.  Denies having any pain when sleeping.  She noticed spicy foods were causing the pain initially but now is present with any meal. She reports her PCP at [place] checked her for H. Pylori due to the symptoms and it came back positive.  Patient reports that during October she presented recurrent episodes of vomiting, usually in the morning, most of it was the food she ate the prior evening.  Due to this, the patient came to the ER on 04/24/2020, CBC and CMP were unremarkable.  Underwent a CT of the abdomen with IV contrast which was unremarkable.  Patient was discharged home with triple regimen for H. Pylori. She finished compliantly the 2 week course of medications of H. Pylori. She states that after this she has not presented any more episodes of vomiting. However she is still having the pain and now is present with any kind of food.  Patient reports that she was taking frequently ibuprofen  for back pain, up to 4 tablets per day (200 mg each). She was taking the medication for a year in between spinal injections for a bulging disc. She has avoided taking ibuprofen since her last ER visit as advised by the physicians. She has not felt any difference when she stopped the ibuprofen.  She reports that she lost close to 60 lb in the last 2 months due to her symptoms.  Patient reports that she had melena before starting the antibiotics, she believes it is still very black despite taking the medication.  The patient denies having any fever, chills, hematochezia, hematemesis, abdominal distention, diarrhea, jaundice, pruritus.  Last WYO:VZCHY Last Colonoscopy:never  FHx: neg for any gastrointestinal/liver disease, no malignancies Social: smokes <1/2 pack a day, neg alcohol or illicit drug use Surgical: non contributory  Past Medical History: Past Medical History:  Diagnosis Date  . Asthma     Past Surgical History: Past Surgical History:  Procedure Laterality Date  . ADENOIDECTOMY    . TONSILLECTOMY    . TYMPANOSTOMY TUBE PLACEMENT      Family History: Family History  Problem Relation Age of Onset  . Asthma Father   . COPD Father   . Asthma Paternal Aunt   . Asthma Paternal Grandmother   . Asthma Paternal Grandfather   . Asthma Paternal Aunt     Social History: Social History   Tobacco Use  Smoking Status Current Every Day Smoker  . Packs/day: 0.25  Smokeless Tobacco Never Used   Social History   Substance and Sexual Activity  Alcohol Use No   Social  History   Substance and Sexual Activity  Drug Use Yes  . Types: Marijuana   Comment: once per week    Allergies: No Known Allergies  Medications: Current Outpatient Medications  Medication Sig Dispense Refill  . albuterol (PROVENTIL HFA;VENTOLIN HFA) 108 (90 Base) MCG/ACT inhaler Inhale 1-2 puffs into the lungs every 6 (six) hours as needed for wheezing or shortness of breath.     .  budesonide-formoterol (SYMBICORT) 80-4.5 MCG/ACT inhaler Inhale 2 puffs into the lungs 2 (two) times daily. (Patient taking differently: Inhale 2 puffs into the lungs daily as needed (for shortness of breath). ) 1 Inhaler 5  . EPINEPHrine (AUVI-Q) 0.3 mg/0.3 mL IJ SOAJ injection Use as directed for severe allergic reaction (Patient taking differently: Inject 0.3 mg into the muscle as needed for anaphylaxis. Use as directed for severe allergic reaction) 2 Device 1  . etonogestrel (NEXPLANON) 68 MG IMPL implant 1 each by Subdermal route once.     Current Facility-Administered Medications  Medication Dose Route Frequency Provider Last Rate Last Admin  . Benralizumab SOSY 30 mg  30 mg Subcutaneous Q28 days Carrie Spruce, MD   30 mg at 09/02/18 3220    Review of Systems: GENERAL: negative for malaise, night sweats HEENT: No changes in hearing or vision, no nose bleeds or other nasal problems. NECK: Negative for lumps, goiter, pain and significant neck swelling RESPIRATORY: Negative for cough, wheezing CARDIOVASCULAR: Negative for chest pain, leg swelling, palpitations, orthopnea GI: SEE HPI MUSCULOSKELETAL: Negative for joint pain or swelling, back pain, and muscle pain. SKIN: Negative for lesions, rash PSYCH: Negative for sleep disturbance, mood disorder and recent psychosocial stressors. HEMATOLOGY Negative for prolonged bleeding, bruising easily, and swollen nodes. ENDOCRINE: Negative for cold or heat intolerance, polyuria, polydipsia and goiter. NEURO: negative for tremor, gait imbalance, syncope and seizures. The remainder of the review of systems is noncontributory.   Physical Exam: BP 111/61 (BP Location: Left Arm, Patient Position: Sitting, Cuff Size: Large)   Pulse (!) 101   Temp 98 F (36.7 C) (Oral)   Ht 5\' 7"  (1.702 m)   Wt 263 lb (119.3 kg)   BMI 41.19 kg/m  GENERAL: The patient is AO x3, in no acute distress. Obese. HEENT: Head is normocephalic and atraumatic. EOMI  are intact. Mouth is well hydrated and without lesions. NECK: Supple. No masses LUNGS: Clear to auscultation. No presence of rhonchi/wheezing/rales. Adequate chest expansion HEART: RRR, normal s1 and s2. ABDOMEN: tender to palpation in the epigastric, LUQ and L flank , no guarding, no peritoneal signs, and nondistended. BS +. No masses. EXTREMITIES: Without any cyanosis, clubbing, rash, lesions or edema. NEUROLOGIC: AOx3, no focal motor deficit. SKIN: no jaundice, no rashes   Imaging/Labs: as above  I personally reviewed and interpreted the available labs, imaging and endoscopic files.  Impression and Plan: Carrie Oneal is a 19 y.o. female with asthma, spinal disk herniation and sciatica, who presents for evaluation of abdominal pain and vomiting.  The patient has presented persistent symptoms of postprandial abdominal pain.  She was adequately treated for H. pylori which could account for some of her symptomatology.  We will need to check if she got cured from this infection with a breath test.  I advised the patient that given the persistence of her symptoms will need to investigate this further with an esophagogastroduodenospy.  At that time, will also obtain small bowel biopsies to rule out celiac disease.  For now, the patient can take Bentyl as needed for abdominal  pain.  If her investigations are negative, I will consider doing a CTA to rule out chronic ischemia, although this seems less likely given her age.  -Schedule EGD -Start Bentyl 1 tablet q12h as needed for abdominal pain -Schedule H. pylori breath test  All questions were answered.      Carrie Blazing, MD Gastroenterology and Hepatology [facility]

## 2020-06-25 DIAGNOSIS — J329 Chronic sinusitis, unspecified: Secondary | ICD-10-CM | POA: Diagnosis not present

## 2020-06-25 DIAGNOSIS — J111 Influenza due to unidentified influenza virus with other respiratory manifestations: Secondary | ICD-10-CM | POA: Diagnosis not present

## 2020-06-25 DIAGNOSIS — Z20828 Contact with and (suspected) exposure to other viral communicable diseases: Secondary | ICD-10-CM | POA: Diagnosis not present

## 2020-06-25 DIAGNOSIS — J45909 Unspecified asthma, uncomplicated: Secondary | ICD-10-CM | POA: Diagnosis not present

## 2020-06-26 ENCOUNTER — Other Ambulatory Visit (INDEPENDENT_AMBULATORY_CARE_PROVIDER_SITE_OTHER): Payer: Self-pay

## 2020-06-26 DIAGNOSIS — R1012 Left upper quadrant pain: Secondary | ICD-10-CM

## 2020-06-26 DIAGNOSIS — Z8619 Personal history of other infectious and parasitic diseases: Secondary | ICD-10-CM

## 2020-07-02 ENCOUNTER — Other Ambulatory Visit (HOSPITAL_COMMUNITY)
Admission: RE | Admit: 2020-07-02 | Discharge: 2020-07-02 | Disposition: A | Discharge status: Home / Self Care | Payer: BC Managed Care – PPO | Source: Ambulatory Visit | Attending: Gastroenterology | Admitting: Gastroenterology

## 2020-07-03 ENCOUNTER — Encounter (HOSPITAL_COMMUNITY): Admission: RE | Payer: Self-pay | Source: Home / Self Care

## 2020-07-03 ENCOUNTER — Ambulatory Visit (HOSPITAL_COMMUNITY)
Admission: RE | Admit: 2020-07-03 | Payer: BC Managed Care – PPO | Source: Home / Self Care | Admitting: Gastroenterology

## 2020-07-03 SURGERY — ESOPHAGOGASTRODUODENOSCOPY (EGD) WITH PROPOFOL
Anesthesia: Monitor Anesthesia Care

## 2020-07-12 ENCOUNTER — Encounter: Payer: Self-pay | Admitting: Emergency Medicine

## 2020-07-12 ENCOUNTER — Ambulatory Visit
Admission: EM | Admit: 2020-07-12 | Discharge: 2020-07-12 | Disposition: A | Discharge status: Home / Self Care | Payer: BC Managed Care – PPO | Attending: Emergency Medicine | Admitting: Emergency Medicine

## 2020-07-12 DIAGNOSIS — J4541 Moderate persistent asthma with (acute) exacerbation: Secondary | ICD-10-CM | POA: Diagnosis not present

## 2020-07-12 MED ORDER — ALBUTEROL SULFATE HFA 108 (90 BASE) MCG/ACT IN AERS
5.0000 | INHALATION_SPRAY | Freq: Once | RESPIRATORY_TRACT | Status: AC
  Administered 2020-07-12: 5 via RESPIRATORY_TRACT

## 2020-07-12 MED ORDER — DEXAMETHASONE SODIUM PHOSPHATE 10 MG/ML IJ SOLN
10.0000 mg | Freq: Once | INTRAMUSCULAR | Status: AC
  Administered 2020-07-12: 10 mg via INTRAMUSCULAR

## 2020-07-12 MED ORDER — PREDNISONE 10 MG (21) PO TBPK: ORAL_TABLET | Freq: Every day | ORAL | 0 refills | Status: DC

## 2020-07-12 NOTE — ED Triage Notes (Signed)
Tested covid positive on 12/28 and has not been able to keep asthma under control since then.  Wheezing started last night.

## 2020-07-12 NOTE — Discharge Instructions (Addendum)
ProAir was given in office Decadron IM was given in office Prednisone taper was prescribed/take as directed  Take steroid as prescribed and to completion Continue to take albuterol as prescribed Follow up with PCP Return here or go to ER if you have any new or worsening symptoms such as shortness of breath, difficulty breathing, accessory muscle use, rib retraction, or if symptoms do not improve with medicatio

## 2020-07-12 NOTE — ED Provider Notes (Signed)
[facility]   [id number] 07/12/20 Arrival Time: 1709   FY:[id number]  SUBJECTIVE: History from: patient.  Carrie Oneal is a 20 y.o. female who presents with complaint of persistent chest tightness, dyspnea and non-productive cough. Triggers: change in weather, cold symptoms, coughing and viral URI. Onset abrupt, approximately 1 day ago. Describes wheezing as moderate when present. Fever: no. Overall normal PO intake without n/v. Sick contacts: Unknown. Typically her asthma is well controlled. Denies fever, chills, nausea, vomiting, SOB, chest pain, abdominal pain, changes in bowel or bladder function.   Inhaler use: Albuterol.   Received flu shot this year: yes.   ROS: As per HPI.  All other pertinent ROS negative.    Past Medical History:  Diagnosis Date  . Asthma    Past Surgical History:  Procedure Laterality Date  . ADENOIDECTOMY    . TONSILLECTOMY    . TYMPANOSTOMY TUBE PLACEMENT     No Known Allergies No current facility-administered medications on file prior to encounter.   Current Outpatient Medications on File Prior to Encounter  Medication Sig Dispense Refill  . albuterol (PROVENTIL HFA;VENTOLIN HFA) 108 (90 Base) MCG/ACT inhaler Inhale 1-2 puffs into the lungs every 6 (six) hours as needed for wheezing or shortness of breath.     . budesonide-formoterol (SYMBICORT) 80-4.5 MCG/ACT inhaler Inhale 2 puffs into the lungs 2 (two) times daily. (Patient taking differently: Inhale 2 puffs into the lungs daily as needed (for shortness of breath). ) 1 Inhaler 5  . dicyclomine (BENTYL) 10 MG capsule Take 1 capsule (10 mg total) by mouth every 12 (twelve) hours as needed (abdominal pain). 60 capsule 1  . EPINEPHrine (AUVI-Q) 0.3 mg/0.3 mL IJ SOAJ injection Use as directed for severe allergic reaction (Patient taking differently: Inject 0.3 mg into the muscle as needed for anaphylaxis. Use as directed for severe allergic reaction) 2 Device 1  . etonogestrel (NEXPLANON) 68  MG IMPL implant 1 each by Subdermal route once.     Social History   Socioeconomic History  . Marital status: Unknown    Spouse name: Not on file  . Number of children: Not on file  . Years of education: Not on file  . Highest education level: Not on file  Occupational History  . Not on file  Tobacco Use  . Smoking status: Current Every Day Smoker    Packs/day: 0.25  . Smokeless tobacco: Never Used  Vaping Use  . Vaping Use: Former  Substance and Sexual Activity  . Alcohol use: No  . Drug use: Yes    Types: Marijuana    Comment: once per week  . Sexual activity: Never  Other Topics Concern  . Not on file  Social History Narrative  . Not on file   Social Determinants of Health   Financial Resource Strain: Not on file  Food Insecurity: Not on file  Transportation Needs: Not on file  Physical Activity: Not on file  Stress: Not on file  Social Connections: Not on file  Intimate Partner Violence: Not on file   Family History  Problem Relation Age of Onset  . Asthma Father   . COPD Father   . Asthma Paternal Aunt   . Asthma Paternal Grandmother   . Asthma Paternal Grandfather   . Asthma Paternal Aunt     OBJECTIVE:  Vitals:   07/12/20 1712 07/12/20 1713 07/12/20 1715  BP: (!) 138/51    Pulse: (!) 117    Resp: (!) 22    Temp: 97.9  F (36.6 C)    TempSrc: Oral    SpO2: (!) 89%  99%  Weight:  270 lb (122.5 kg)   Height:  5\' 7"  (1.702 m)      General appearance: alert; appears fatigued HEENT: nasal congestion; clear runny nose; throat irritation secondary to post-nasal drainage Neck: supple without LAD Lungs: unlabored respirations, mild bilateral wheezing; cough: moderate; no significant respiratory distress Skin: warm and dry Psychological: alert and cooperative; normal mood and affect  Imaging: No results found.  ASSESSMENT & PLAN:  1. Moderate persistent asthma with acute exacerbation     Inhaler treatment needed: yes. Improvement:  moderate.  Meds ordered this encounter  Medications  . dexamethasone (DECADRON) injection 10 mg   Discharge instructions  ProAir was given in office Decadron IM was given in office Prednisone taper was prescribed/take as directed  Take steroid as prescribed and to completion Continue to take albuterol as prescribed Follow up with PCP Return here or go to ER if you have any new or worsening symptoms such as shortness of breath, difficulty breathing, accessory muscle use, rib retraction, or if symptoms do not improve with medication    Reviewed expectations re: course of current medical issues. Questions answered. Outlined signs and symptoms indicating need for more acute intervention. Patient verbalized understanding. After Visit Summary given.          , FNP 07/12/20 1803

## 2020-07-28 ENCOUNTER — Emergency Department (HOSPITAL_COMMUNITY)
Admission: EM | Admit: 2020-07-28 | Discharge: 2020-07-28 | Disposition: A | Discharge status: Home / Self Care | Payer: BC Managed Care – PPO | Attending: Emergency Medicine | Admitting: Emergency Medicine

## 2020-07-28 ENCOUNTER — Encounter (HOSPITAL_COMMUNITY): Payer: Self-pay | Admitting: Emergency Medicine

## 2020-07-28 ENCOUNTER — Other Ambulatory Visit: Payer: Self-pay

## 2020-07-28 DIAGNOSIS — R062 Wheezing: Secondary | ICD-10-CM | POA: Diagnosis present

## 2020-07-28 DIAGNOSIS — Z8616 Personal history of COVID-19: Secondary | ICD-10-CM | POA: Insufficient documentation

## 2020-07-28 DIAGNOSIS — J4541 Moderate persistent asthma with (acute) exacerbation: Secondary | ICD-10-CM | POA: Diagnosis not present

## 2020-07-28 DIAGNOSIS — F17210 Nicotine dependence, cigarettes, uncomplicated: Secondary | ICD-10-CM | POA: Insufficient documentation

## 2020-07-28 DIAGNOSIS — R000 Tachycardia, unspecified: Secondary | ICD-10-CM | POA: Diagnosis not present

## 2020-07-28 DIAGNOSIS — Z79899 Other long term (current) drug therapy: Secondary | ICD-10-CM | POA: Insufficient documentation

## 2020-07-28 MED ORDER — IPRATROPIUM BROMIDE 0.02 % IN SOLN
0.5000 mg | Freq: Once | RESPIRATORY_TRACT | Status: AC
  Administered 2020-07-28: 0.5 mg via RESPIRATORY_TRACT
  Filled 2020-07-28: qty 2.5

## 2020-07-28 MED ORDER — PREDNISONE 10 MG (21) PO TBPK: ORAL_TABLET | Freq: Every day | ORAL | 0 refills | Status: DC

## 2020-07-28 MED ORDER — MAGNESIUM SULFATE 2 GM/50ML IV SOLN
2.0000 g | Freq: Once | INTRAVENOUS | Status: AC
  Administered 2020-07-28: 2 g via INTRAVENOUS
  Filled 2020-07-28: qty 50

## 2020-07-28 MED ORDER — ALBUTEROL (5 MG/ML) CONTINUOUS INHALATION SOLN
10.0000 mg/h | INHALATION_SOLUTION | Freq: Once | RESPIRATORY_TRACT | Status: AC
  Administered 2020-07-28: 10 mg/h via RESPIRATORY_TRACT
  Filled 2020-07-28: qty 20

## 2020-07-28 MED ORDER — METHYLPREDNISOLONE SODIUM SUCC 125 MG IJ SOLR
125.0000 mg | Freq: Once | INTRAMUSCULAR | Status: AC
  Administered 2020-07-28: 125 mg via INTRAVENOUS
  Filled 2020-07-28: qty 2

## 2020-07-28 NOTE — Discharge Instructions (Addendum)
You were seen in the emergency department for an exacerbation of your asthma.  You received medications with improvement in your symptoms.  We have sent another prescription for steroid taper to your pharmacy.  Please continue to use albuterol every 4 hours as needed for shortness of breath.  Follow-up with your doctor.  Return to the emergency department if any worsening or concerning symptoms.  Please consider stopping smoking

## 2020-07-28 NOTE — ED Provider Notes (Signed)
Signout from Dr. Lynelle Doctor.  20 year old female with history of asthma.  Recent Covid infection last month.  Few days of increased shortness of breath with nebulizer not helping.  She has received IV steroids IV magnesium and is on a continuous neb.  Plan is to reassess after treatment. Physical Exam  BP (!) 124/102 (BP Location: Right Arm)   Pulse (!) 114   Temp 98.7 F (37.1 C) (Oral)   Resp 19   Ht 5\' 7"  (1.702 m)   Wt 122.5 kg   SpO2 98%   BMI 42.29 kg/m   Physical Exam  ED Course/Procedures     Procedures  MDM  9:20 AM.  After continuous neb patient was reassessed.  Moving good air.  No wheezing.  Feels improved.  She is comfortable discharge.       , MD 07/28/20 [phone]

## 2020-07-28 NOTE — ED Provider Notes (Signed)
[facility] EMERGENCY DEPARTMENT Provider Note   CSN: [id number] Arrival date & time: 07/28/20  0548   Time seen 6:11 AM  History Chief Complaint  Patient presents with  . Shortness of Breath    Carrie Oneal is a 20 y.o. female.  HPI   Patient states she had Covid on December 28.  Since then she has been having problems controlling her asthma.  She states a few days ago she started having increasing wheezing and shortness of breath.  She states today her nebulizer is not helping.  She denies fever, sore throat, or rhinorrhea.  She has a mild cough.  She states her chest feels tight and also the center of her chest hurts when she breathes.  She denies nausea, vomiting, or diarrhea.  Patient still smokes less than a half a pack per day.  She states she has had asthma since she was a child.  Her last admission was a few years ago and she has never had to be on a ventilator.  She does usually require steroids.  PCP Lawerance Sabal, [place]   Past Medical History:  Diagnosis Date  . Asthma     Patient Active Problem List   Diagnosis Date Noted  . Abdominal pain 05/29/2020  . History of Helicobacter infection 05/29/2020  . Severe persistent asthma without complication 08/11/2016  . Seasonal and perennial allergic rhinitis 08/11/2016    Past Surgical History:  Procedure Laterality Date  . ADENOIDECTOMY    . TONSILLECTOMY    . TYMPANOSTOMY TUBE PLACEMENT       OB History   No obstetric history on file.     Family History  Problem Relation Age of Onset  . Asthma Father   . COPD Father   . Asthma Paternal Aunt   . Asthma Paternal Grandmother   . Asthma Paternal Grandfather   . Asthma Paternal Aunt     Social History   Tobacco Use  . Smoking status: Current Every Day Smoker    Packs/day: 0.25  . Smokeless tobacco: Never Used  Vaping Use  . Vaping Use: Former  Substance Use Topics  . Alcohol use: No  . Drug use: Yes    Types: Marijuana    Comment: once per week     Home Medications Prior to Admission medications   Medication Sig Start Date End Date Taking? Authorizing Provider  albuterol (PROVENTIL HFA;VENTOLIN HFA) 108 (90 Base) MCG/ACT inhaler Inhale 1-2 puffs into the lungs every 6 (six) hours as needed for wheezing or shortness of breath.     [provider]  budesonide-formoterol (SYMBICORT) 80-4.5 MCG/ACT inhaler Inhale 2 puffs into the lungs 2 (two) times daily. Patient taking differently: Inhale 2 puffs into the lungs daily as needed (for shortness of breath).  07/08/18   Alfonse Spruce, MD  dicyclomine (BENTYL) 10 MG capsule Take 1 capsule (10 mg total) by mouth every 12 (twelve) hours as needed (abdominal pain). 05/29/20   Dolores Frame, MD  EPINEPHrine (AUVI-Q) 0.3 mg/0.3 mL IJ SOAJ injection Use as directed for severe allergic reaction Patient taking differently: Inject 0.3 mg into the muscle as needed for anaphylaxis. Use as directed for severe allergic reaction 09/29/16   Alfonse Spruce, MD  etonogestrel (NEXPLANON) 68 MG IMPL implant 1 each by Subdermal route once.    [provider]  predniSONE (STERAPRED UNI-PAK 21 TAB) 10 MG (21) TBPK tablet Take by mouth daily. Take 6 tabs by mouth daily  for 1 days, then  5 tabs for 1 days, then 4 tabs for 1 days, then 3 tabs for 1 days, 2 tabs for 1 days, then 1 tab by mouth daily for 1 days 07/12/20   Durward Parcel, FNP    Allergies    Patient has no known allergies.  Review of Systems   Review of Systems  All other systems reviewed and are negative.   Physical Exam Updated Vital Signs BP (!) 124/102 (BP Location: Right Arm)   Pulse (!) 114   Temp 98.7 F (37.1 C) (Oral)   Resp 19   Ht 5\' 7"  (1.702 m)   Wt 122.5 kg   SpO2 98%   BMI 42.29 kg/m   Physical Exam Vitals and nursing note reviewed.  Constitutional:      General: She is not in acute distress.    Appearance: Normal appearance. She is obese. She is not ill-appearing or  toxic-appearing.  HENT:     Head: Normocephalic and atraumatic.     Right Ear: External ear normal.     Left Ear: External ear normal.  Eyes:     Extraocular Movements: Extraocular movements intact.     Conjunctiva/sclera: Conjunctivae normal.     Pupils: Pupils are equal, round, and reactive to light.  Cardiovascular:     Rate and Rhythm: Regular rhythm. Tachycardia present.     Pulses: Normal pulses.     Heart sounds: Normal heart sounds.  Pulmonary:     Effort: Tachypnea, accessory muscle usage, prolonged expiration and respiratory distress present.     Breath sounds: Examination of the right-upper field reveals wheezing. Examination of the left-upper field reveals wheezing. Examination of the right-middle field reveals wheezing. Examination of the left-middle field reveals wheezing. Examination of the right-lower field reveals wheezing. Examination of the left-lower field reveals wheezing. Decreased breath sounds and wheezing present.  Musculoskeletal:        General: Normal range of motion.     Cervical back: Normal range of motion.  Skin:    General: Skin is warm and dry.  Neurological:     General: No focal deficit present.     Mental Status: She is alert and oriented to person, place, and time.     Cranial Nerves: No cranial nerve deficit.  Psychiatric:        Mood and Affect: Mood normal.        Behavior: Behavior normal.        Thought Content: Thought content normal.     ED Results / Procedures / Treatments   Labs (all labs ordered are listed, but only abnormal results are displayed) Labs Reviewed - No data to display  EKG None  Radiology No results found.  Procedures .Critical Care Performed by: , MD Authorized by: Devoria Albe, MD   Critical care provider statement:    Critical care time (minutes):  32   Critical care was necessary to treat or prevent imminent or life-threatening deterioration of the following conditions:  Respiratory failure    Critical care was time spent personally by me on the following activities:  Examination of patient, obtaining history from patient or surrogate, pulse oximetry and review of old charts     Medications Ordered in ED Medications  magnesium sulfate IVPB 2 g 50 mL (2 g Intravenous New Bag/Given 07/28/20 0638)  albuterol (PROVENTIL,VENTOLIN) solution continuous neb (10 mg/hr Nebulization Given 07/28/20 0647)  ipratropium (ATROVENT) nebulizer solution 0.5 mg (0.5 mg Nebulization Given 07/28/20 0647)  methylPREDNISolone sodium succinate (SOLU-MEDROL)  125 mg/2 mL injection 125 mg (125 mg Intravenous Given 07/28/20 0636)    ED Course  I have reviewed the triage vital signs and the nursing notes.  Pertinent labs & imaging results that were available during my care of the patient were reviewed by me and considered in my medical decision making (see chart for details).    MDM Rules/Calculators/A&P                         Patient was started on albuterol and Atrovent nebulizer continuous for 10 mg.  She was given IV Solu-Medrol and IV magnesium.  Her last labs from November 4 showed BUN 10 and creatinine 0.86 which are normal.  Recheck at 6:55 AM patient is on her continuous nebulizer.  She indicates she feels like it starting to help.  Pt was left at change of shift with Dr Charm Barges to recheck after her continuous nebulizer with albuterol and atrovent.   Final Clinical Impression(s) / ED Diagnoses Final diagnoses:  Moderate persistent asthma with exacerbation    Rx / DC Orders  Disposition pending  Devoria Albe, MD, Concha Pyo, MD 07/28/20 [phone]

## 2020-07-28 NOTE — ED Triage Notes (Signed)
Pt c/o difficulty breathing x 2 days. States her breathing tx and rescue inhaler has not helped.

## 2020-11-30 IMAGING — CT CT ABD-PELV W/ CM
2 of 4 series · 17 of 46 positions shown, 19 images · IV contrast (Omnipaque or Isovue)
Comparison: None.

CLINICAL DATA: Abdominal pain

EXAM:
CT ABDOMEN AND PELVIS WITH CONTRAST
TECHNIQUE: Multidetector CT imaging of the abdomen and pelvis was performed
using the standard protocol following bolus administration of
intravenous contrast.
CONTRAST:  100mL OMNIPAQUE IOHEXOL 300 MG/ML  SOLN

[Series 2: axial st · axial · 0.68mm/px · z∈[-491,-61]mm · 14 of 94 slices shown, 16 images]
[im 4/94  soft-tissue]
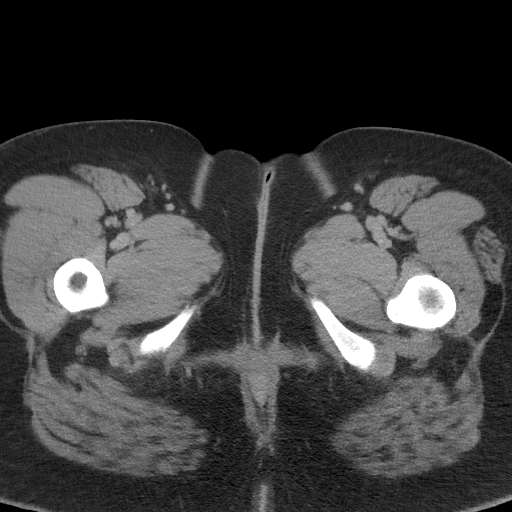
[im 4/94  bone]
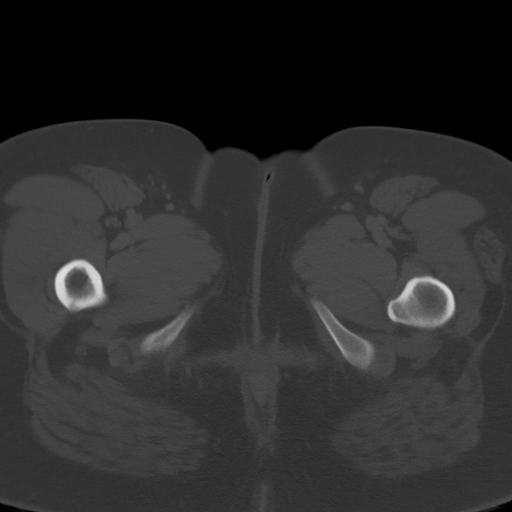
[im 12/94  soft-tissue]
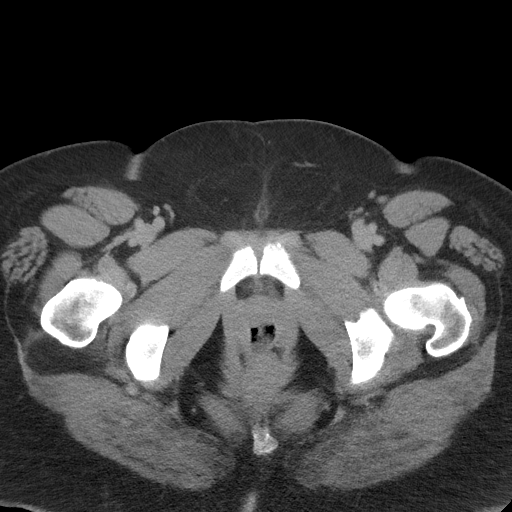
[im 20/94  soft-tissue]
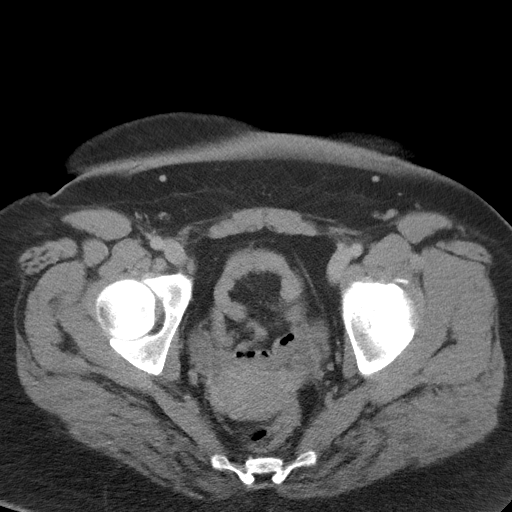
[im 24/94  soft-tissue]
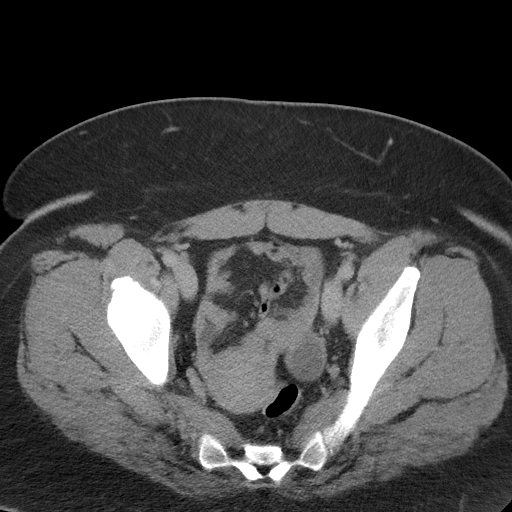
[im 32/94  soft-tissue]
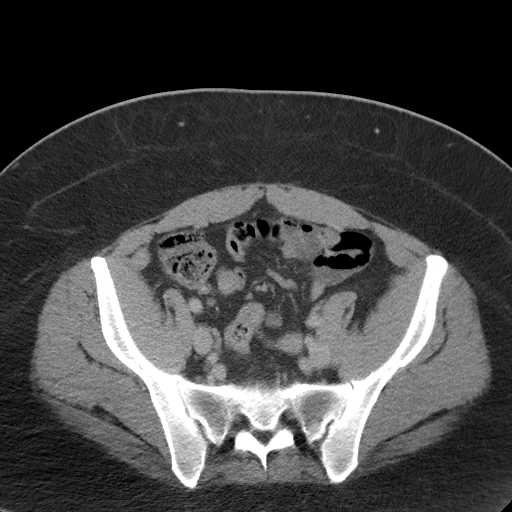
[im 39/94  soft-tissue]
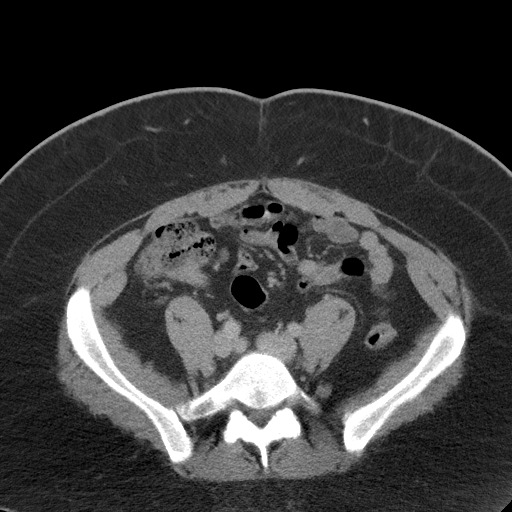
[im 43/94  soft-tissue]
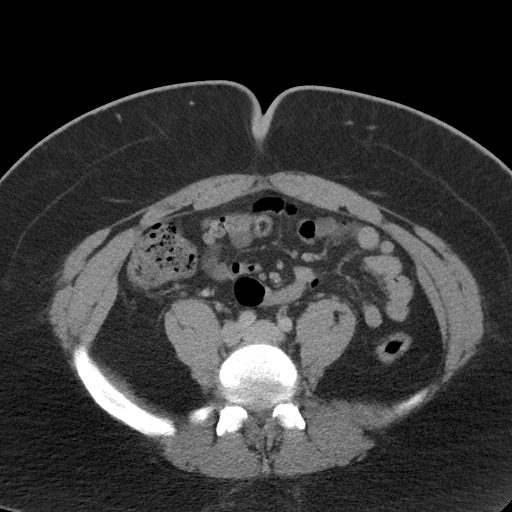
[im 51/94  soft-tissue]
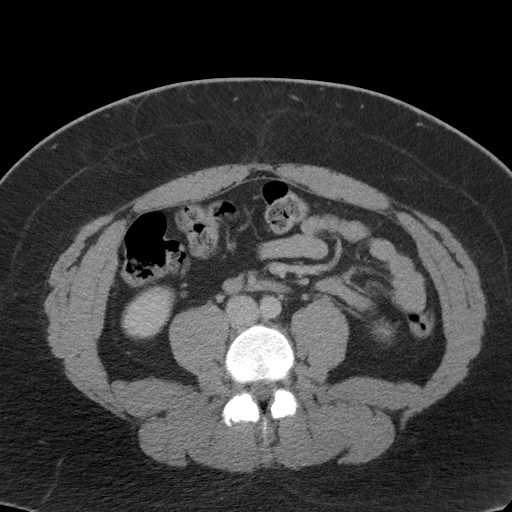
[im 55/94  soft-tissue]
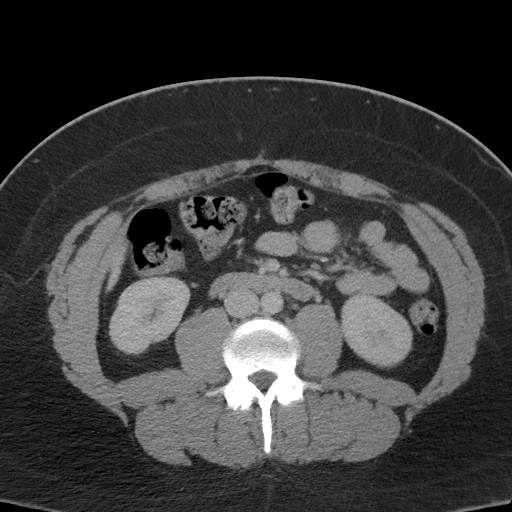
[im 55/94  bone]
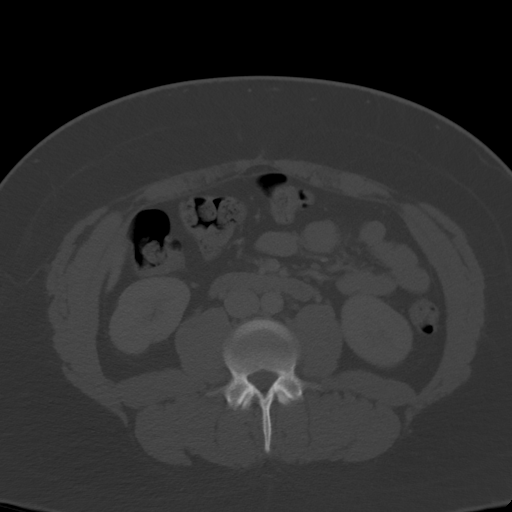
[im 63/94  soft-tissue]
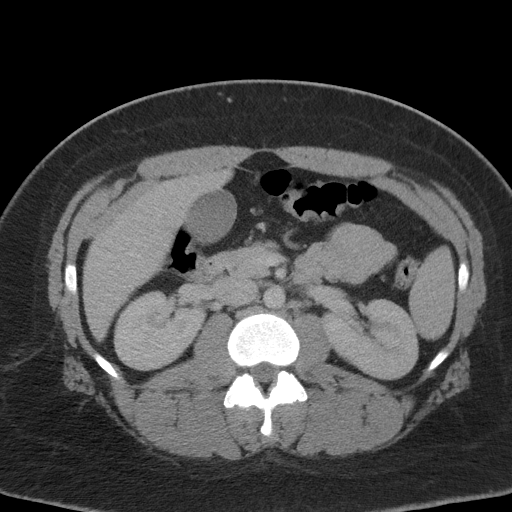
[im 70/94  soft-tissue]
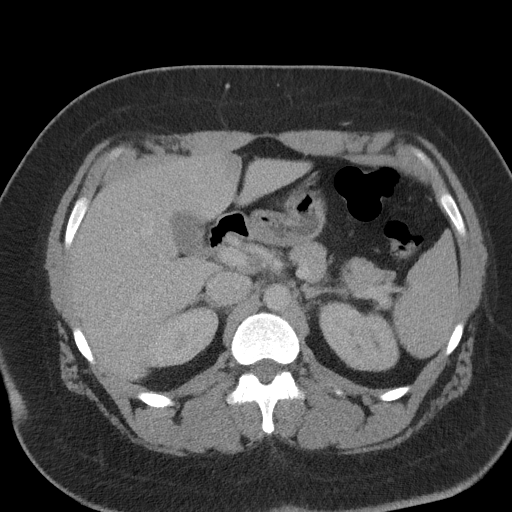
[im 74/94  soft-tissue]
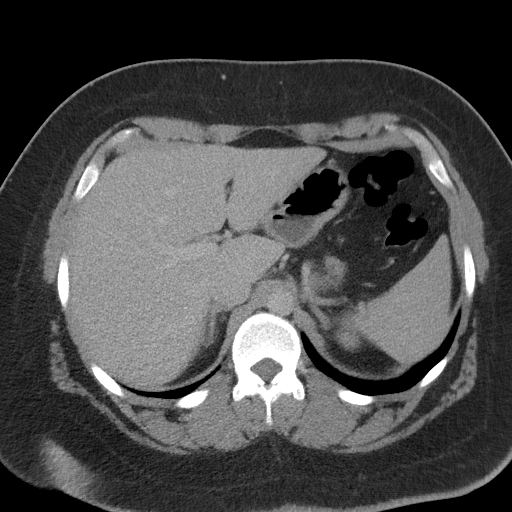
[im 82/94  soft-tissue]
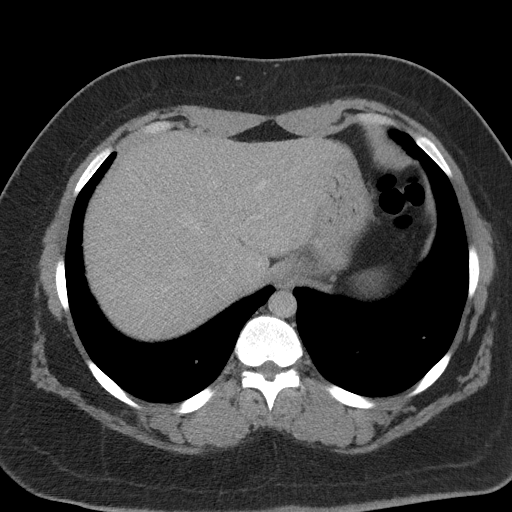
[im 90/94  soft-tissue]
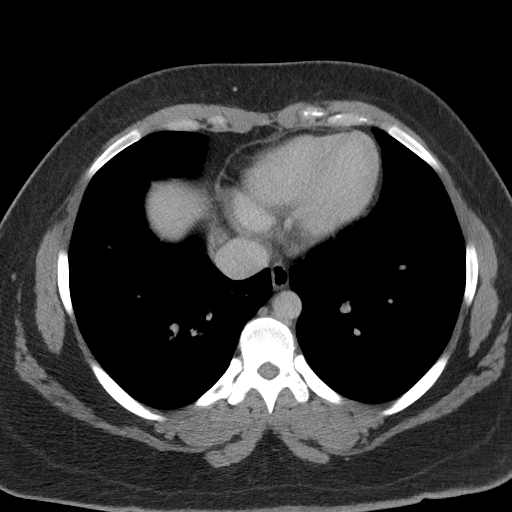

[Series 5: coronal st · coronal · 0.91mm/px · 3 of 98 slices shown]
[im 33/98  soft-tissue]
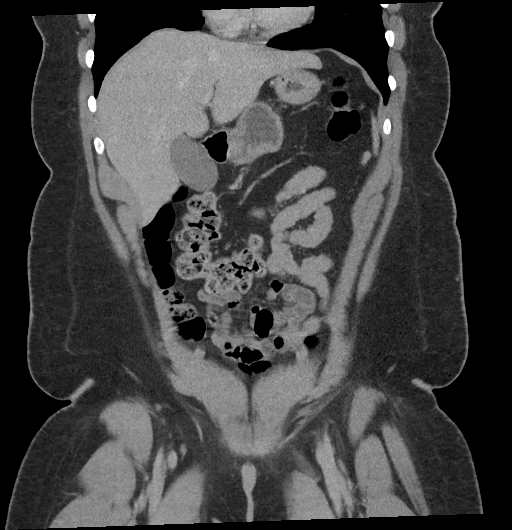
[im 44/98  soft-tissue]
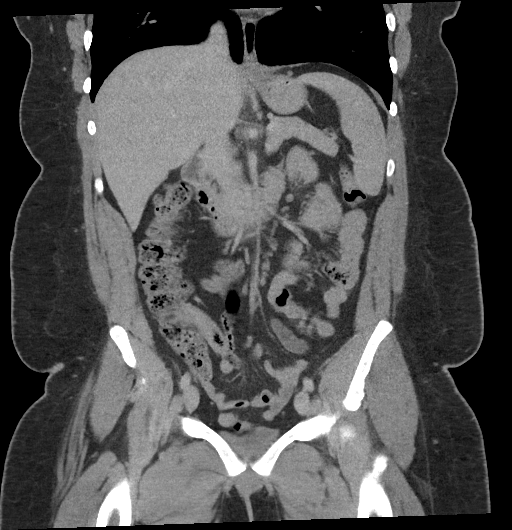
[im 54/98  soft-tissue]
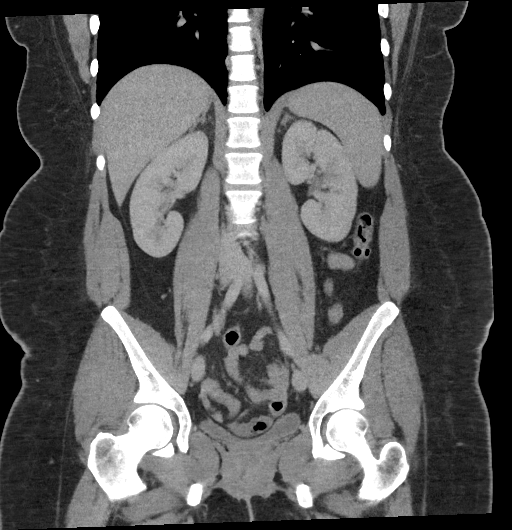

[17 of 46 positions shown; findings below may reference images not displayed]

FINDINGS: Lower chest: The visualized heart size within normal limits. No
pericardial fluid/thickening.

No hiatal hernia.

The visualized portions of the lungs are clear.

Hepatobiliary: The liver is normal in density without focal
abnormality.The main portal vein is patent. No evidence of calcified
gallstones, gallbladder wall thickening or biliary dilatation.

Pancreas: Unremarkable. No pancreatic ductal dilatation or
surrounding inflammatory changes.

Spleen: Normal in size without focal abnormality.

Adrenals/Urinary Tract: Both adrenal glands appear normal. The
kidneys and collecting system appear normal without evidence of
urinary tract calculus or hydronephrosis. Bladder is unremarkable.

Stomach/Bowel: The stomach, small bowel, and colon are normal in
appearance. No inflammatory changes, wall thickening, or obstructive
findings.The appendix is normal.

Vascular/Lymphatic: There are no enlarged mesenteric,
retroperitoneal, or pelvic lymph nodes. No significant vascular
findings are present.

Reproductive: Probable 2.5 cm left ovarian cyst versus dominant
follicle.

Other: No evidence of abdominal wall mass or hernia.

Musculoskeletal: No acute or significant osseous findings.
IMPRESSION: No acute intra-abdominal to explain the patient's symptoms.

Probable 2.5 cm left ovarian cyst versus dominant follicle.

## 2022-04-21 ENCOUNTER — Encounter (INDEPENDENT_AMBULATORY_CARE_PROVIDER_SITE_OTHER): Payer: Self-pay | Admitting: *Deleted

## 2022-06-04 ENCOUNTER — Encounter (INDEPENDENT_AMBULATORY_CARE_PROVIDER_SITE_OTHER): Payer: Self-pay | Admitting: Gastroenterology

## 2022-06-25 ENCOUNTER — Ambulatory Visit (INDEPENDENT_AMBULATORY_CARE_PROVIDER_SITE_OTHER): Payer: BC Managed Care – PPO | Admitting: Gastroenterology

## 2022-07-02 ENCOUNTER — Ambulatory Visit (INDEPENDENT_AMBULATORY_CARE_PROVIDER_SITE_OTHER): Payer: BC Managed Care – PPO | Admitting: Gastroenterology

## 2022-09-10 ENCOUNTER — Ambulatory Visit (INDEPENDENT_AMBULATORY_CARE_PROVIDER_SITE_OTHER): Payer: BC Managed Care – PPO | Admitting: Gastroenterology

## 2022-09-11 ENCOUNTER — Encounter (INDEPENDENT_AMBULATORY_CARE_PROVIDER_SITE_OTHER): Payer: Self-pay | Admitting: *Deleted

## 2023-03-30 ENCOUNTER — Encounter (INDEPENDENT_AMBULATORY_CARE_PROVIDER_SITE_OTHER): Payer: Self-pay | Admitting: Gastroenterology

## 2023-03-30 ENCOUNTER — Ambulatory Visit (INDEPENDENT_AMBULATORY_CARE_PROVIDER_SITE_OTHER): Payer: BC Managed Care – PPO | Admitting: Gastroenterology

## 2023-03-30 VITALS — BP 117/76 | HR 86 | Temp 98.2°F | Ht 67.0 in | Wt 287.3 lb

## 2023-03-30 DIAGNOSIS — R109 Unspecified abdominal pain: Secondary | ICD-10-CM

## 2023-03-30 DIAGNOSIS — R112 Nausea with vomiting, unspecified: Secondary | ICD-10-CM | POA: Diagnosis not present

## 2023-03-30 DIAGNOSIS — R1310 Dysphagia, unspecified: Secondary | ICD-10-CM

## 2023-03-30 DIAGNOSIS — R1084 Generalized abdominal pain: Secondary | ICD-10-CM

## 2023-03-30 DIAGNOSIS — Z6841 Body Mass Index (BMI) 40.0 and over, adult: Secondary | ICD-10-CM | POA: Insufficient documentation

## 2023-03-30 DIAGNOSIS — K219 Gastro-esophageal reflux disease without esophagitis: Secondary | ICD-10-CM

## 2023-03-30 NOTE — Patient Instructions (Addendum)
It was very nice to meet you today, as dicussed with will plan for the following :  - Upper endoscopy with BRAVO   GERD recommendations: Pantoprazole 40 mg 30 minutes before breakfast 30 minutes before dinner  1) Avoid coffee, tea, cola beverages, carbonated beverages, spicy foods, greasy foods, foods high in acid content (e.g. tomatoes and citrus fruits), chocolate, and peppermint 2) Avoid drinking alcoholic beverages 3) Avoid smoking 4) Eat small meals and keep weight within normal range 5) Avoid recumbent posture for 3 hours post-prandially 6) Elevate head of bed

## 2023-03-30 NOTE — Progress Notes (Signed)
Vista Lawman , M.D. Gastroenterology & Hepatology [facility] Gastroenterology [address] [place], [place] [address] Primary Care Physician: Lawerance Sabal, [place] [address] [place] [address]  Chief Complaint:  regurgitation, heartburn, nausea vomiting abdominal pain  History of Present Illness: Carrie Oneal is a 22 y.o. female with asthma, spinal disk herniation and sciatica,PCOS, Class III Obesity  who presents for evaluation of regurgitation, heartburn, nausea vomiting abdominal pain  Reports that she has been suffering from increased symptoms for the past 2 years which is worsening now.  Reports retrosternal chest pain on swallowing food at least 3 times a week she would have regurgitation of food and would feel nauseous.  This is accompanied by postprandial abdominal pain as well.  Patient has retrosternal burning sensation.  Patient takes Protonix twice daily but after food with incomplete resolution of symptoms  Patient reports that it is been she was checked for H. pylori and she was treated for it.  Recently she had blood work and ultrasound at [facility].  Patient has remote exposure to NSAID use. The patient denies having any fever, chills, hematochezia, melena, hematemesis, diarrhea, jaundice, pruritus or weight loss.  Last GNF:AO[facility] Last Colonoscopy:none  FHx: neg for any gastrointestinal/liver disease, no malignancies Social: neg smoking, alcohol or illicit drug use Surgical: no abdominal surgeries  Past Medical History: Past Medical History:  Diagnosis Date   Asthma     Past Surgical History: Past Surgical History:  Procedure Laterality Date   ADENOIDECTOMY     TONSILLECTOMY     TYMPANOSTOMY TUBE PLACEMENT      Family History: Family History  Problem Relation Age of Onset   Asthma Father    COPD Father    Asthma Paternal Aunt    Asthma Paternal Grandmother    Asthma Paternal Grandfather    Asthma Paternal Aunt      Social History: Social History   Tobacco Use  Smoking Status Every Day   Current packs/day: 0.25   Types: Cigarettes  Smokeless Tobacco Never   Social History   Substance and Sexual Activity  Alcohol Use No   Social History   Substance and Sexual Activity  Drug Use Yes   Types: Marijuana   Comment: once per week    Allergies: No Known Allergies  Medications: Current Outpatient Medications  Medication Sig Dispense Refill   albuterol (PROVENTIL HFA;VENTOLIN HFA) 108 (90 Base) MCG/ACT inhaler Inhale 1-2 puffs into the lungs every 6 (six) hours as needed for wheezing or shortness of breath.      budesonide-formoterol (SYMBICORT) 80-4.5 MCG/ACT inhaler Inhale 2 puffs into the lungs 2 (two) times daily. (Patient taking differently: Inhale 2 puffs into the lungs daily as needed (for shortness of breath).) 1 Inhaler 5   EPINEPHrine (AUVI-Q) 0.3 mg/0.3 mL IJ SOAJ injection Use as directed for severe allergic reaction (Patient taking differently: Inject 0.3 mg into the muscle as needed for anaphylaxis. Use as directed for severe allergic reaction) 2 Device 1   etonogestrel (NEXPLANON) 68 MG IMPL implant 1 each by Subdermal route once.     ondansetron (ZOFRAN-ODT) 4 MG disintegrating tablet Take 4 mg by mouth.     pantoprazole (PROTONIX) 40 MG tablet Take 40 mg by mouth 2 (two) times daily.     No current facility-administered medications for this visit.    Review of Systems: GENERAL: negative for malaise, night sweats HEENT: No changes in hearing or vision, no nose bleeds or other nasal problems. NECK: Negative  for lumps, goiter, pain and significant neck swelling RESPIRATORY: Negative for cough, wheezing CARDIOVASCULAR: Negative for chest pain, leg swelling, palpitations, orthopnea GI: SEE HPI MUSCULOSKELETAL: Negative for joint pain or swelling, back pain, and muscle pain. SKIN: Negative for lesions, rash HEMATOLOGY Negative for prolonged bleeding, bruising easily, and  swollen nodes. ENDOCRINE: Negative for cold or heat intolerance, polyuria, polydipsia and goiter. NEURO: negative for tremor, gait imbalance, syncope and seizures. The remainder of the review of systems is noncontributory.   Physical Exam: BP 117/76 (BP Location: Right Arm, Patient Position: Sitting, Cuff Size: Large)   Pulse 86   Temp 98.2 F (36.8 C) (Oral)   Ht 5\' 7"  (1.702 m)   Wt 287 lb 4.8 oz (130.3 kg)   BMI 45.00 kg/m  GENERAL: The patient is AO x3, in no acute distress. HEENT: Head is normocephalic and atraumatic. EOMI are intact. Mouth is well hydrated and without lesions. NECK: Supple. No masses LUNGS: Clear to auscultation. No presence of rhonchi/wheezing/rales. Adequate chest expansion HEART: RRR, normal s1 and s2. ABDOMEN: Soft, nontender, no guarding, no peritoneal signs, and nondistended. BS +. No masses. EXTREMITIES: Without any cyanosis, clubbing, rash, lesions or edema. NEUROLOGIC: AOx3, no focal motor deficit. SKIN: no jaundice, no rashes   Imaging/Labs: as above     Latest Ref Rng & Units 05/02/2020    3:03 AM 04/24/2020    9:38 PM 05/25/2017   10:27 AM  CBC  WBC 4.0 - 10.5 K/uL 10.3  10.3  8.8   Hemoglobin 12.0 - 15.0 g/dL 09.8  11.9  14.7   Hematocrit 36.0 - 46.0 % 41.3  42.1  41.8   Platelets 150 - 400 K/uL 310  305  367    No results found for: "IRON", "TIBC", "FERRITIN"  I personally reviewed and interpreted the available labs, imaging and endoscopic files.  Impression and Plan: Carrie Oneal is a 22 y.o. female with asthma, spinal disk herniation and sciatica,PCOS, Class III Obesity  who presents for evaluation of regurgitation, heartburn, nausea vomiting abdominal pain.  #Refractory GERD #Vomiting/Regurgitation #Odynophagia  Patient has at least 2-year history of worsening retrosternal chest pain upon swallowing occasional regurgitation symptoms without adequate response to PPI twice daily  This is considered refractory GERD and hence  wireless pH capsule monitoring is recommended  This also could be EOE, achalasia and cannabinoid hyperemesis syndrome  Young patient dysphagia and uncontrolled GERD achalasia needs to be ruled out if upper endoscopy and probable is unremarkable with high-resolution manometry  Patient with marijuana exposure can also present with abdominal pain recurrent nausea vomiting as part of cannabinoid hyperemesis syndrome  Recs:   Upper endoscopy with esophageal biopsies, plus and minus dilation and Bravo placement.  Would also plan for biopsies for celiac disease (duodenal biopsies)  PPI twice daily 30 minutes before breakfast and 30 minutes before dinner, to be stopped 10 days before procedure may take Tums during that time  Recommended 10 to 15% body weight loss in next 1 year  1) Avoid coffee, tea, cola beverages, carbonated beverages, spicy foods, greasy foods, foods high in acid content (e.g. tomatoes and citrus fruits), chocolate, and peppermint 2) Avoid drinking alcoholic beverages 3) Avoid smoking 4) Eat small meals and keep weight within normal range 5) Avoid recumbent posture for 3 hours post-prandially 6) Elevate head of bed  All questions were answered.      Vista Lawman, MD Gastroenterology and Hepatology [facility] Gastroenterology   This chart has been completed using Dragon Medical  Dictation software, and while attempts have been made to ensure accuracy , certain words and phrases may not be transcribed as intended

## 2023-05-10 ENCOUNTER — Telehealth (INDEPENDENT_AMBULATORY_CARE_PROVIDER_SITE_OTHER): Payer: Self-pay | Admitting: *Deleted

## 2023-05-10 DIAGNOSIS — K219 Gastro-esophageal reflux disease without esophagitis: Secondary | ICD-10-CM

## 2023-05-10 NOTE — Telephone Encounter (Signed)
Called pt schedule EGD/BRAVO with Dr. Tasia Catchings on 12/11. She is aware she will need to hold reflux meds x 10 days prior and needs UPT prior. Instructions sent via mychart.    Checked [employer] and does not require PA.

## 2023-05-24 NOTE — Telephone Encounter (Signed)
Pt left voicemail that she is unable to get on Mychart to see instructions. Contacted pt and went over instructions with her. Will also mail instructions to pt.

## 2023-06-07 ENCOUNTER — Other Ambulatory Visit (HOSPITAL_COMMUNITY)
Admission: RE | Admit: 2023-06-07 | Discharge: 2023-06-07 | Disposition: A | Discharge status: Home / Self Care | Payer: BC Managed Care – PPO | Source: Ambulatory Visit | Attending: Gastroenterology | Admitting: Gastroenterology

## 2023-06-07 DIAGNOSIS — R1310 Dysphagia, unspecified: Secondary | ICD-10-CM | POA: Diagnosis not present

## 2023-06-07 DIAGNOSIS — K219 Gastro-esophageal reflux disease without esophagitis: Secondary | ICD-10-CM | POA: Insufficient documentation

## 2023-06-07 DIAGNOSIS — F1290 Cannabis use, unspecified, uncomplicated: Secondary | ICD-10-CM | POA: Diagnosis not present

## 2023-06-07 DIAGNOSIS — J45909 Unspecified asthma, uncomplicated: Secondary | ICD-10-CM | POA: Diagnosis not present

## 2023-06-07 DIAGNOSIS — K449 Diaphragmatic hernia without obstruction or gangrene: Secondary | ICD-10-CM | POA: Diagnosis not present

## 2023-06-07 DIAGNOSIS — R1013 Epigastric pain: Secondary | ICD-10-CM | POA: Diagnosis present

## 2023-06-07 DIAGNOSIS — K2970 Gastritis, unspecified, without bleeding: Secondary | ICD-10-CM | POA: Diagnosis not present

## 2023-06-07 DIAGNOSIS — E66813 Obesity, class 3: Secondary | ICD-10-CM | POA: Diagnosis not present

## 2023-06-07 DIAGNOSIS — F17210 Nicotine dependence, cigarettes, uncomplicated: Secondary | ICD-10-CM | POA: Diagnosis not present

## 2023-06-07 LAB — PREGNANCY, URINE: Preg Test, Ur: NEGATIVE

## 2023-06-09 ENCOUNTER — Encounter (HOSPITAL_COMMUNITY): Payer: Self-pay

## 2023-06-09 ENCOUNTER — Other Ambulatory Visit: Payer: Self-pay

## 2023-06-09 ENCOUNTER — Ambulatory Visit (HOSPITAL_COMMUNITY)
Admission: RE | Admit: 2023-06-09 | Discharge: 2023-06-09 | Disposition: A | Discharge status: Home / Self Care | Payer: BC Managed Care – PPO | Attending: Gastroenterology | Admitting: Gastroenterology

## 2023-06-09 ENCOUNTER — Ambulatory Visit (HOSPITAL_COMMUNITY): Payer: BC Managed Care – PPO | Admitting: Anesthesiology

## 2023-06-09 ENCOUNTER — Encounter (HOSPITAL_COMMUNITY)
Admission: RE | Disposition: A | Discharge status: Home / Self Care | Payer: Self-pay | Source: Home / Self Care | Attending: Gastroenterology

## 2023-06-09 DIAGNOSIS — E66813 Obesity, class 3: Secondary | ICD-10-CM | POA: Insufficient documentation

## 2023-06-09 DIAGNOSIS — R1310 Dysphagia, unspecified: Secondary | ICD-10-CM | POA: Insufficient documentation

## 2023-06-09 DIAGNOSIS — F17210 Nicotine dependence, cigarettes, uncomplicated: Secondary | ICD-10-CM | POA: Insufficient documentation

## 2023-06-09 DIAGNOSIS — K2970 Gastritis, unspecified, without bleeding: Secondary | ICD-10-CM | POA: Insufficient documentation

## 2023-06-09 DIAGNOSIS — J45909 Unspecified asthma, uncomplicated: Secondary | ICD-10-CM | POA: Insufficient documentation

## 2023-06-09 DIAGNOSIS — K2950 Unspecified chronic gastritis without bleeding: Secondary | ICD-10-CM

## 2023-06-09 DIAGNOSIS — K449 Diaphragmatic hernia without obstruction or gangrene: Secondary | ICD-10-CM | POA: Insufficient documentation

## 2023-06-09 DIAGNOSIS — K219 Gastro-esophageal reflux disease without esophagitis: Secondary | ICD-10-CM | POA: Diagnosis not present

## 2023-06-09 DIAGNOSIS — F1290 Cannabis use, unspecified, uncomplicated: Secondary | ICD-10-CM | POA: Insufficient documentation

## 2023-06-09 HISTORY — PX: ESOPHAGOGASTRODUODENOSCOPY (EGD) WITH PROPOFOL: SHX5813

## 2023-06-09 HISTORY — PX: BRAVO PH STUDY: SHX5421

## 2023-06-09 HISTORY — PX: BIOPSY: SHX5522

## 2023-06-09 SURGERY — ESOPHAGOGASTRODUODENOSCOPY (EGD) WITH PROPOFOL
Anesthesia: General

## 2023-06-09 MED ORDER — LIDOCAINE HCL (CARDIAC) PF 100 MG/5ML IV SOSY
PREFILLED_SYRINGE | INTRAVENOUS | Status: DC | PRN
  Administered 2023-06-09: 60 mg via INTRATRACHEAL

## 2023-06-09 MED ORDER — PROPOFOL 500 MG/50ML IV EMUL
INTRAVENOUS | Status: DC | PRN
  Administered 2023-06-09: 150 ug/kg/min via INTRAVENOUS

## 2023-06-09 MED ORDER — LACTATED RINGERS IV SOLN: INTRAVENOUS | Status: DC

## 2023-06-09 MED ORDER — PROPOFOL 10 MG/ML IV BOLUS
INTRAVENOUS | Status: DC | PRN
  Administered 2023-06-09: 60 mg via INTRAVENOUS
  Administered 2023-06-09: 50 mg via INTRAVENOUS
  Administered 2023-06-09: 40 mg via INTRAVENOUS
  Administered 2023-06-09: 50 mg via INTRAVENOUS
  Administered 2023-06-09: 100 mg via INTRAVENOUS

## 2023-06-09 MED ORDER — GLYCOPYRROLATE PF 0.2 MG/ML IJ SOSY
PREFILLED_SYRINGE | INTRAMUSCULAR | Status: DC | PRN
  Administered 2023-06-09: .2 mg via INTRAVENOUS

## 2023-06-09 NOTE — Anesthesia Postprocedure Evaluation (Signed)
Anesthesia Post Note  Patient: Carrie Oneal  Procedure(s) Performed: ESOPHAGOGASTRODUODENOSCOPY (EGD) WITH PROPOFOL BRAVO PH STUDY BIOPSY  Patient location during evaluation: Endoscopy Anesthesia Type: General Level of consciousness: awake and alert Pain management: pain level controlled Vital Signs Assessment: post-procedure vital signs reviewed and stable Respiratory status: spontaneous breathing Cardiovascular status: blood pressure returned to baseline and stable Postop Assessment: no apparent nausea or vomiting Anesthetic complications: no   No notable events documented.   Last Vitals:  Vitals:   06/09/23 1029 06/09/23 1127  BP: (!) 118/52 103/65  Pulse: 82 77  Resp: 20   Temp: 36.8 C 36.5 C  SpO2: 99% 100%    Last Pain:  Vitals:   06/09/23 1127  TempSrc: Oral  PainSc:                  Glynn Octave

## 2023-06-09 NOTE — Discharge Instructions (Addendum)

## 2023-06-09 NOTE — Anesthesia Preprocedure Evaluation (Signed)
Anesthesia Evaluation  Patient identified by MRN, date of birth, ID band Patient awake    Reviewed: Allergy & Precautions, H&P , NPO status , Patient's Chart, lab work & pertinent test results, reviewed documented beta blocker date and time   Airway Mallampati: II  TM Distance: >3 FB Neck ROM: full    Dental no notable dental hx.    Pulmonary neg pulmonary ROS, asthma , Current Smoker   Pulmonary exam normal breath sounds clear to auscultation       Cardiovascular Exercise Tolerance: Good hypertension, negative cardio ROS  Rhythm:regular Rate:Normal     Neuro/Psych negative neurological ROS  negative psych ROS   GI/Hepatic negative GI ROS, Neg liver ROS,GERD  ,,  Endo/Other  negative endocrine ROS    Renal/GU negative Renal ROS  negative genitourinary   Musculoskeletal   Abdominal   Peds  Hematology negative hematology ROS (+)   Anesthesia Other Findings   Reproductive/Obstetrics negative OB ROS                             Anesthesia Physical Anesthesia Plan  ASA: 2  Anesthesia Plan: General   Post-op Pain Management:    Induction:   PONV Risk Score and Plan: Propofol infusion  Airway Management Planned:   Additional Equipment:   Intra-op Plan:   Post-operative Plan:   Informed Consent: I have reviewed the patients History and Physical, chart, labs and discussed the procedure including the risks, benefits and alternatives for the proposed anesthesia with the patient or authorized representative who has indicated his/her understanding and acceptance.     Dental Advisory Given  Plan Discussed with: CRNA  Anesthesia Plan Comments:        Anesthesia Quick Evaluation

## 2023-06-09 NOTE — H&P (Signed)
Primary Care Physician:  Dion Saucier, PA-C Primary Gastroenterologist:  Dr. Tasia Catchings  Pre-Procedure History & Physical: HPI: Carrie Oneal is a 22 y.o. female with asthma, spinal disk herniation and sciatica,PCOS, Class III Obesity  who presents for evaluation of regurgitation, heartburn, nausea vomiting abdominal pain   Reports that she has been suffering from increased symptoms for the past 2 years which is worsening now.  Reports retrosternal chest pain on swallowing food at least 3 times a week she would have regurgitation of food and would feel nauseous.  This is accompanied by postprandial abdominal pain as well.  Patient has retrosternal burning sensation.  Patient takes Protonix twice daily but after food with incomplete resolution of symptoms   Patient reports that it is been she was checked for H. pylori and she was treated for it.  Recently she had blood work and ultrasound at [facility].  Patient has remote exposure to NSAID use. The patient denies having any fever, chills, hematochezia, melena, hematemesis, diarrhea, jaundice, pruritus or weight loss.   Last UJW:JXBJ Last Colonoscopy:none   FHx: neg for any gastrointestinal/liver disease, no malignancies Social: neg smoking, alcohol or illicit drug use Surgical: no abdominal surgeries    Past Medical History:  Diagnosis Date   Asthma     Past Surgical History:  Procedure Laterality Date   ADENOIDECTOMY     TONSILLECTOMY     TYMPANOSTOMY TUBE PLACEMENT      Prior to Admission medications   Medication Sig Start Date End Date Taking? Authorizing Provider  albuterol (PROVENTIL HFA;VENTOLIN HFA) 108 (90 Base) MCG/ACT inhaler Inhale 1-2 puffs into the lungs every 6 (six) hours as needed for wheezing or shortness of breath.    Yes [provider]  budesonide-formoterol (SYMBICORT) 80-4.5 MCG/ACT inhaler Inhale 2 puffs into the lungs 2 (two) times daily. Patient taking differently: Inhale 2 puffs into the lungs  daily as needed (for shortness of breath). 07/08/18  Yes Alfonse Spruce, MD  EPINEPHrine (AUVI-Q) 0.3 mg/0.3 mL IJ SOAJ injection Use as directed for severe allergic reaction Patient taking differently: Inject 0.3 mg into the muscle as needed for anaphylaxis. Use as directed for severe allergic reaction 09/29/16  Yes Alfonse Spruce, MD  etonogestrel (NEXPLANON) 68 MG IMPL implant 1 each by Subdermal route once.   Yes [provider]  ondansetron (ZOFRAN-ODT) 4 MG disintegrating tablet Take 4 mg by mouth. 03/22/23  Yes [provider]  pantoprazole (PROTONIX) 40 MG tablet Take 40 mg by mouth 2 (two) times daily.   Yes [provider]    Allergies as of 05/10/2023   (No Known Allergies)    Family History  Problem Relation Age of Onset   Asthma Father    COPD Father    Asthma Paternal Aunt    Asthma Paternal Grandmother    Asthma Paternal Grandfather    Asthma Paternal Aunt     Social History   Socioeconomic History   Marital status: Single    Spouse name: Not on file   Number of children: Not on file   Years of education: Not on file   Highest education level: Not on file  Occupational History   Not on file  Tobacco Use   Smoking status: Every Day    Current packs/day: 0.25    Types: Cigarettes   Smokeless tobacco: Never  Vaping Use   Vaping status: Former  Substance and Sexual Activity   Alcohol use: No   Drug use: Yes  Types: Marijuana    Comment: once per week   Sexual activity: Never  Other Topics Concern   Not on file  Social History Narrative   Not on file   Social Determinants of Health   Financial Resource Strain: Not on file  Food Insecurity: Not on file  Transportation Needs: Not on file  Physical Activity: Not on file  Stress: Not on file  Social Connections: Not on file  Intimate Partner Violence: Not on file    Review of Systems: See HPI, otherwise negative ROS  Physical Exam: Vital signs in last 24  hours:     General:   Alert,  Well-developed, well-nourished, pleasant and cooperative in NAD Head:  Normocephalic and atraumatic. Eyes:  Sclera clear, no icterus.   Conjunctiva pink. Ears:  Normal auditory acuity. Nose:  No deformity, discharge,  or lesions. Msk:  Symmetrical without gross deformities. Normal posture. Extremities:  Without clubbing or edema. Neurologic:  Alert and  oriented x4;  grossly normal neurologically. Skin:  Intact without significant lesions or rashes. Psych:  Alert and cooperative. Normal mood and affect.  Impression/Plan: Carrie Oneal is a 22 y.o. female with asthma, spinal disk herniation and sciatica,PCOS, Class III Obesity  who presents for evaluation of regurgitation, heartburn, nausea vomiting abdominal pain . Proceed with EGD with BRAVO for refractory GERD  The risks of the procedure including infection, bleed, or perforation as well as benefits, limitations, alternatives and imponderables have been reviewed with the patient. Questions have been answered. All parties agreeable.

## 2023-06-09 NOTE — Op Note (Signed)
[facility] Patient Name: Carrie Oneal Procedure Date: 06/09/2023 10:49 AM MRN: [id number] Date of Birth: 2001/04/24 Attending MD: Sanjuan Dame , MD, [id number] CSN: [id number] Age: 22 Admit Type: Outpatient Procedure:                Upper GI endoscopy Indications:              Epigastric abdominal pain, Odynophagia, Esophageal                            reflux symptoms that persist despite appropriate                            therapy Providers:                Sanjuan Dame, MD, Edrick Kins, RN, Pandora Leiter, Technician Referring MD:              Medicines:                Monitored Anesthesia Care Complications:            No immediate complications. Estimated Blood Loss:     Estimated blood loss was minimal. Procedure:                Pre-Anesthesia Assessment:                           - Prior to the procedure, a History and Physical                            was performed, and patient medications and                            allergies were reviewed. The patient's tolerance of                            previous anesthesia was also reviewed. The risks                            and benefits of the procedure and the sedation                            options and risks were discussed with the patient.                            All questions were answered, and informed consent                            was obtained. Prior Anticoagulants: The patient has                            taken no anticoagulant or antiplatelet agents. ASA                            Grade Assessment: II -  A patient with mild systemic                            disease. After reviewing the risks and benefits,                            the patient was deemed in satisfactory condition to                            undergo the procedure.                           After obtaining informed consent, the endoscope was                            passed under direct vision.  Throughout the                            procedure, the patient's blood pressure, pulse, and                            oxygen saturations were monitored continuously. The                            GIF-H190 ([id number]) scope was introduced through the                            mouth, and advanced to the second part of duodenum.                            The upper GI endoscopy was accomplished without                            difficulty. The patient tolerated the procedure                            well. Scope In: 11:09:37 AM Scope Out: 11:20:53 AM Total Procedure Duration: 0 hours 11 minutes 16 seconds  Findings:      Esophagogastric landmarks were identified: the Z-line was found at 36 cm       and the gastroesophageal junction was found at 40 cm from the incisors.      There is no endoscopic evidence of esophagitis in the entire esophagus.       Biopsies were obtained from the proximal and distal esophagus with cold       forceps for histology of suspected eosinophilic esophagitis. The BRAVO       capsule with delivery system was introduced through the mouth and       advanced into the esophagus, such that the BRAVO pH capsule was       positioned 30 cm from the incisors, which was 6 cm proximal to the GE       junction. The BRAVO pH capsule was then deployed and attached to the       esophageal mucosa. The delivery system was then withdrawn. Endoscopy was       utilized for probe  placement and diagnostic evaluation.      Mild inflammation characterized by erosions was found in the gastric       antrum. Biopsies were taken with a cold forceps for histology.      A 4 cm hiatal hernia was present.      The gastroesophageal flap valve was visualized endoscopically and       classified as Hill Grade III (minimal fold, loose to endoscope, hiatal       hernia likely).      The duodenal bulb and second portion of the duodenum were normal. Impression:               - Esophagogastric  landmarks identified.                           - Gastritis. Biopsied.                           - 4 cm hiatal hernia.                           - Gastroesophageal flap valve classified as Hill                            Grade III (minimal fold, loose to endoscope, hiatal                            hernia likely).                           - Normal duodenal bulb and second portion of the                            duodenum.                           - Biopsies were taken with a cold forceps for                            evaluation of eosinophilic esophagitis.                           - The BRAVO pH capsule was deployed. Moderate Sedation:      Per Anesthesia Care Recommendation:           - Patient has a contact number available for                            emergencies. The signs and symptoms of potential                            delayed complications were discussed with the                            patient. Return to normal activities tomorrow.                            Written discharge instructions were  provided to the                            patient.                           - Resume previous diet.                           - Continue present medications.                           - Await pathology results.                           -BRAVO instructions                           -antireflux measures, may benefit from [facility] repair in                            future                           -10% body weight loss in next 1 year Procedure Code(s):        --- Professional ---                           [phone], Esophagogastroduodenoscopy, flexible,                            transoral; with biopsy, single or multiple Diagnosis Code(s):        --- Professional ---                           K29.70, Gastritis, unspecified, without bleeding                           K44.9, Diaphragmatic hernia without obstruction or                            gangrene                            R10.13, Epigastric pain                           R13.10, Dysphagia, unspecified                           K21.9, Gastro-esophageal reflux disease without                            esophagitis CPT copyright 2022 [employer]. All rights reserved. The codes documented in this report are preliminary and upon coder review may  be revised to meet current compliance requirements. Sanjuan Dame, MD Sanjuan Dame, MD 06/09/2023 11:27:35 AM This report has been signed electronically. Number of Addenda: 0

## 2023-06-09 NOTE — Transfer of Care (Signed)
Immediate Anesthesia Transfer of Care Note  Patient: Carrie Oneal  Procedure(s) Performed: ESOPHAGOGASTRODUODENOSCOPY (EGD) WITH PROPOFOL BRAVO PH STUDY BIOPSY  Patient Location: Endoscopy Unit  Anesthesia Type:General  Level of Consciousness: awake  Airway & Oxygen Therapy: Patient Spontanous Breathing  Post-op Assessment: Report given to RN  Post vital signs: Reviewed and stable  Last Vitals:  Vitals Value Taken Time  BP 103/65 06/09/23 1127  Temp 36.5 C 06/09/23 1127  Pulse 77 06/09/23 1127  Resp    SpO2 100 % 06/09/23 1127    Last Pain:  Vitals:   06/09/23 1127  TempSrc: Oral  PainSc:          Complications: No notable events documented.

## 2023-06-10 LAB — SURGICAL PATHOLOGY

## 2023-06-16 ENCOUNTER — Encounter (HOSPITAL_COMMUNITY): Payer: Self-pay | Admitting: Gastroenterology

## 2023-06-16 ENCOUNTER — Other Ambulatory Visit (INDEPENDENT_AMBULATORY_CARE_PROVIDER_SITE_OTHER): Payer: Self-pay | Admitting: Gastroenterology

## 2023-06-16 DIAGNOSIS — K219 Gastro-esophageal reflux disease without esophagitis: Secondary | ICD-10-CM

## 2023-06-16 MED ORDER — OMEPRAZOLE 40 MG PO CPDR: 40.0000 mg | DELAYED_RELEASE_CAPSULE | Freq: Two times a day (BID) | ORAL | 0 refills | Status: AC

## 2023-06-16 NOTE — Procedures (Signed)
EGD with biopsies and BRAVO results  A. DUODENUM, BIOPSY:  Duodenal mucosa with normal villous architecture.  No villous atrophy or increased intraepithelial lymphocytes.   B. STOMACH, RANDOM, BIOPSY:  Gastric mucosa with slight chronic inflammation.  Negative for Helicobacter pylori.   C. ESOPHAGUS, BIOPSY:  Unremarkable squamous mucosa.  Negative for eosinophilic esophagitis.   No H. Pylori bacteria in stomach , or any early cancer changes to the stomach mucosa ( Intestinal metaplasia)   Normal biopsies of the food-pipe ( no eosinophilic esophagitis )   BRAVO results     Recommendation :  Patient symptoms are likely due to NERD with 4 cm HH and positive BRAVO study   Will switch pantoprazole to omeprazole 40mg  BID for 2 weeks than once daily thereafter  If symptoms do not improve with acid suppression may need pH impedance ( on PPI ) and HRM in future   Goal of 10% body weight loss  If patient doesn't tolerate GLP1 agonist ( to be managed by PCP ) and inadequate weight loss with lifestyle modification in 6 months , may refer patient for possible gastric bypass surgical evaluation  (patient likely is not a TIF or Sleeve gastrectomy candidate)   Patient to follow up in GI clinic . Attempted to call the patient over the phone

## 2023-06-17 ENCOUNTER — Encounter (INDEPENDENT_AMBULATORY_CARE_PROVIDER_SITE_OTHER): Payer: Self-pay | Admitting: *Deleted

## 2023-06-18 ENCOUNTER — Telehealth (INDEPENDENT_AMBULATORY_CARE_PROVIDER_SITE_OTHER): Payer: Self-pay | Admitting: *Deleted

## 2023-06-18 NOTE — Telephone Encounter (Signed)
Dr. Tasia Catchings wanted me to let you know to stop pantoprazole and start taking Omeprazole 40mg  twice    daily for 2 weeks than once daily thereafter.   Thanks,   Toniann Fail - Dr. Marcelino Freestone nurse  Patient did not ready mychart message I sent yesterday that I copied above and I called her today and discussed with her. She verbalized understanding.

## 2023-07-21 ENCOUNTER — Ambulatory Visit (INDEPENDENT_AMBULATORY_CARE_PROVIDER_SITE_OTHER): Payer: BC Managed Care – PPO | Admitting: Gastroenterology

## 2024-11-27 ENCOUNTER — Ambulatory Visit (HOSPITAL_COMMUNITY): Admitting: Registered Nurse
